# Patient Record
Sex: Male | Born: 1960 | Race: Black or African American | Hispanic: No | Marital: Married | Smoking: Former smoker
Health system: Southern US, Community
[De-identification: ages and names within clinical notes are randomized; demographics above are authoritative.]

## PROBLEM LIST (undated history)

## (undated) DIAGNOSIS — E119 Type 2 diabetes mellitus without complications: Secondary | ICD-10-CM

## (undated) DIAGNOSIS — M48062 Spinal stenosis, lumbar region with neurogenic claudication: Secondary | ICD-10-CM

## (undated) DIAGNOSIS — I10 Essential (primary) hypertension: Secondary | ICD-10-CM

## (undated) DIAGNOSIS — M5416 Radiculopathy, lumbar region: Secondary | ICD-10-CM

## (undated) DIAGNOSIS — M1990 Unspecified osteoarthritis, unspecified site: Secondary | ICD-10-CM

## (undated) DIAGNOSIS — I2510 Atherosclerotic heart disease of native coronary artery without angina pectoris: Secondary | ICD-10-CM

## (undated) DIAGNOSIS — I517 Cardiomegaly: Secondary | ICD-10-CM

## (undated) DIAGNOSIS — Z955 Presence of coronary angioplasty implant and graft: Secondary | ICD-10-CM

## (undated) DIAGNOSIS — K219 Gastro-esophageal reflux disease without esophagitis: Secondary | ICD-10-CM

## (undated) DIAGNOSIS — E7800 Pure hypercholesterolemia, unspecified: Secondary | ICD-10-CM

## (undated) DIAGNOSIS — D649 Anemia, unspecified: Secondary | ICD-10-CM

## (undated) HISTORY — PX: NO PAST SURGERIES: SHX2092

---

## 2014-12-28 ENCOUNTER — Other Ambulatory Visit: Payer: Self-pay | Admitting: Internal Medicine

## 2014-12-28 ENCOUNTER — Ambulatory Visit
Admission: RE | Admit: 2014-12-28 | Discharge: 2014-12-28 | Disposition: A | Discharge status: Home / Self Care | Payer: Managed Care, Other (non HMO) | Source: Ambulatory Visit | Attending: Internal Medicine | Admitting: Internal Medicine

## 2014-12-28 DIAGNOSIS — M25551 Pain in right hip: Secondary | ICD-10-CM

## 2016-04-21 DIAGNOSIS — Z862 Personal history of diseases of the blood and blood-forming organs and certain disorders involving the immune mechanism: Secondary | ICD-10-CM | POA: Insufficient documentation

## 2016-04-21 DIAGNOSIS — E119 Type 2 diabetes mellitus without complications: Secondary | ICD-10-CM | POA: Insufficient documentation

## 2016-07-16 DIAGNOSIS — M1611 Unilateral primary osteoarthritis, right hip: Secondary | ICD-10-CM | POA: Diagnosis present

## 2016-07-26 ENCOUNTER — Encounter
Admission: RE | Admit: 2016-07-26 | Discharge: 2016-07-26 | Disposition: A | Discharge status: Home / Self Care | Payer: Managed Care, Other (non HMO) | Source: Ambulatory Visit | Attending: Orthopedic Surgery | Admitting: Orthopedic Surgery

## 2016-07-26 DIAGNOSIS — E119 Type 2 diabetes mellitus without complications: Secondary | ICD-10-CM | POA: Insufficient documentation

## 2016-07-26 DIAGNOSIS — I10 Essential (primary) hypertension: Secondary | ICD-10-CM | POA: Diagnosis not present

## 2016-07-26 HISTORY — DX: Essential (primary) hypertension: I10

## 2016-07-26 HISTORY — DX: Type 2 diabetes mellitus without complications: E11.9

## 2016-07-26 HISTORY — DX: Unspecified osteoarthritis, unspecified site: M19.90

## 2016-07-26 LAB — BASIC METABOLIC PANEL
ANION GAP: 7 (ref 5–15)
BUN: 9 mg/dL (ref 6–20)
CHLORIDE: 106 mmol/L (ref 101–111)
CO2: 27 mmol/L (ref 22–32)
Calcium: 9.7 mg/dL (ref 8.9–10.3)
Creatinine, Ser: 1.2 mg/dL (ref 0.61–1.24)
GFR calc non Af Amer: >60 mL/min (ref >60)
GLUCOSE: 106 mg/dL — AB (ref 65–99)
Potassium: 3.8 mmol/L (ref 3.5–5.1)
Sodium: 140 mmol/L (ref 135–145)

## 2016-07-26 LAB — URINALYSIS COMPLETE WITH MICROSCOPIC (ARMC ONLY)
BACTERIA UA: NONE SEEN
Bilirubin Urine: NEGATIVE
Glucose, UA: NEGATIVE mg/dL
HGB URINE DIPSTICK: NEGATIVE
Ketones, ur: NEGATIVE mg/dL
LEUKOCYTES UA: NEGATIVE
Nitrite: NEGATIVE
PH: 5 (ref 5.0–8.0)
Protein, ur: NEGATIVE mg/dL
Specific Gravity, Urine: 1.016 (ref 1.005–1.030)

## 2016-07-26 LAB — CBC
HEMATOCRIT: 41.4 % (ref 40.0–52.0)
HEMOGLOBIN: 13.5 g/dL (ref 13.0–18.0)
MCH: 27.5 pg (ref 26.0–34.0)
MCHC: 32.5 g/dL (ref 32.0–36.0)
MCV: 84.5 fL (ref 80.0–100.0)
Platelets: 362 10*3/uL (ref 150–440)
RBC: 4.9 MIL/uL (ref 4.40–5.90)
RDW: 14.7 % — ABNORMAL HIGH (ref 11.5–14.5)
WBC: 8.2 10*3/uL (ref 3.8–10.6)

## 2016-07-26 LAB — APTT: APTT: 33 s (ref 24–36)

## 2016-07-26 LAB — TYPE AND SCREEN
ABO/RH(D): B POS
Antibody Screen: NEGATIVE

## 2016-07-26 LAB — SEDIMENTATION RATE: SED RATE: 11 mm/h (ref 0–20)

## 2016-07-26 LAB — SURGICAL PCR SCREEN
MRSA, PCR: NEGATIVE
STAPHYLOCOCCUS AUREUS: POSITIVE — AB

## 2016-07-26 LAB — PROTIME-INR
INR: 1.02
Prothrombin Time: 13.4 seconds (ref 11.4–15.2)

## 2016-07-26 NOTE — Patient Instructions (Signed)
  Your procedure is scheduled ZO:XWRUEAon:Monday Oct. 23 , 2017. Report to Same Day Surgery. To find out your arrival time please call [phone] between 1PM - 3PM on Friday Oct. 20, 2017.  Remember: Instructions that are not followed completely may result in serious medical risk, up to and including death, or upon the discretion of your surgeon and anesthesiologist your surgery may need to be rescheduled.    _x___ 1. Do not eat food or drink liquids after midnight. No gum chewing or hard candies.     _x___ 2. No Alcohol for 24 hours before or after surgery.   ____ 3. Bring all medications with you on the day of surgery if instructed.    __x__ 4. Notify your doctor if there is any change in your medical condition     (cold, fever, infections).    _____ 5. No smoking 24 hours prior to surgery.     Do not wear jewelry, make-up, hairpins, clips or nail polish.  Do not wear lotions, powders, or perfumes.   Do not shave 48 hours prior to surgery. Men may shave face and neck.  Do not bring valuables to the hospital.    [facility] is not responsible for any belongings or valuables.               Contacts, dentures or bridgework may not be worn into surgery.  Leave your suitcase in the car. After surgery it may be brought to your room.  For patients admitted to the hospital, discharge time is determined by your treatment team.   Patients discharged the day of surgery will not be allowed to drive home.    Please read over the following fact sheets that you were given:   [facility] Pre[facility]  __x__ Take these medicines the morning of surgery with A SIP OF WATER:    1. losartan (COZAAR)   2. atorvastatin (LIPITOR)    ____ Fleet Enema (as directed)   _x___ Use CHG Soap as directed on instruction sheet  ____ Use inhalers on the day of surgery and bring to hospital day of surgery  __x__ Stop metformin 2 days prior to surgery August 05, 2016.    ____ Take 1/2 of usual insulin  dose the night before surgery and none on the morning of surgery.   __x__ Stop aspirin 7 days prior.  __x__ Stop Anti-inflammatories such as Advil, Aleve, Ibuprofen, Motrin, Naproxen, Naprosyn, Goodies powders or aspirin products. OK to take Tylenol or Tramadol.   ____ Stop supplements until after surgery.    ____ Bring C-Pap to the hospital.

## 2016-07-27 LAB — HEMOGLOBIN A1C
Hgb A1c MFr Bld: 6.1 % — ABNORMAL HIGH (ref 4.8–5.6)
Mean Plasma Glucose: 128 mg/dL

## 2016-07-27 LAB — URINE CULTURE
CULTURE: NO GROWTH
Special Requests: NORMAL

## 2016-07-27 NOTE — Pre-Procedure Instructions (Signed)
Spoke with Consuella LoseElaine at Dr. Elenor LegatoHooten's office to let her know a fax was sent with the nasal swab results:MRSA negative and Staph Aureus positive.

## 2016-08-07 ENCOUNTER — Encounter: Payer: Self-pay | Admitting: Orthopedic Surgery

## 2016-08-07 ENCOUNTER — Inpatient Hospital Stay: Payer: Managed Care, Other (non HMO)

## 2016-08-07 ENCOUNTER — Inpatient Hospital Stay: Payer: Managed Care, Other (non HMO) | Admitting: Anesthesiology

## 2016-08-07 ENCOUNTER — Inpatient Hospital Stay
Admission: RE | Admit: 2016-08-07 | Discharge: 2016-08-09 | DRG: 470 | Disposition: A | Discharge status: Home / Self Care | Payer: Managed Care, Other (non HMO) | Source: Ambulatory Visit | Attending: Orthopedic Surgery | Admitting: Orthopedic Surgery

## 2016-08-07 ENCOUNTER — Encounter
Admission: RE | Disposition: A | Discharge status: Home / Self Care | Payer: Self-pay | Source: Ambulatory Visit | Attending: Orthopedic Surgery

## 2016-08-07 DIAGNOSIS — Z79899 Other long term (current) drug therapy: Secondary | ICD-10-CM

## 2016-08-07 DIAGNOSIS — Z833 Family history of diabetes mellitus: Secondary | ICD-10-CM | POA: Diagnosis not present

## 2016-08-07 DIAGNOSIS — R262 Difficulty in walking, not elsewhere classified: Secondary | ICD-10-CM

## 2016-08-07 DIAGNOSIS — M1611 Unilateral primary osteoarthritis, right hip: Secondary | ICD-10-CM | POA: Diagnosis present

## 2016-08-07 DIAGNOSIS — D62 Acute posthemorrhagic anemia: Secondary | ICD-10-CM | POA: Diagnosis not present

## 2016-08-07 DIAGNOSIS — E119 Type 2 diabetes mellitus without complications: Secondary | ICD-10-CM | POA: Diagnosis present

## 2016-08-07 DIAGNOSIS — Z8249 Family history of ischemic heart disease and other diseases of the circulatory system: Secondary | ICD-10-CM | POA: Diagnosis not present

## 2016-08-07 DIAGNOSIS — Z7984 Long term (current) use of oral hypoglycemic drugs: Secondary | ICD-10-CM

## 2016-08-07 DIAGNOSIS — Z96649 Presence of unspecified artificial hip joint: Secondary | ICD-10-CM

## 2016-08-07 DIAGNOSIS — Z809 Family history of malignant neoplasm, unspecified: Secondary | ICD-10-CM | POA: Diagnosis not present

## 2016-08-07 DIAGNOSIS — Z888 Allergy status to other drugs, medicaments and biological substances status: Secondary | ICD-10-CM | POA: Diagnosis not present

## 2016-08-07 DIAGNOSIS — E7800 Pure hypercholesterolemia, unspecified: Secondary | ICD-10-CM | POA: Insufficient documentation

## 2016-08-07 DIAGNOSIS — Z87891 Personal history of nicotine dependence: Secondary | ICD-10-CM | POA: Diagnosis not present

## 2016-08-07 DIAGNOSIS — I10 Essential (primary) hypertension: Secondary | ICD-10-CM | POA: Diagnosis present

## 2016-08-07 HISTORY — PX: TOTAL HIP ARTHROPLASTY: SHX124

## 2016-08-07 LAB — GLUCOSE, CAPILLARY
GLUCOSE-CAPILLARY: 126 mg/dL — AB (ref 65–99)
GLUCOSE-CAPILLARY: 98 mg/dL (ref 65–99)
GLUCOSE-CAPILLARY: 119 mg/dL — AB (ref 65–99)
Glucose-Capillary: 131 mg/dL — ABNORMAL HIGH (ref 65–99)
Glucose-Capillary: 108 mg/dL — ABNORMAL HIGH (ref 65–99)

## 2016-08-07 LAB — ABO/RH: ABO/RH(D): B POS

## 2016-08-07 SURGERY — ARTHROPLASTY, HIP, TOTAL,POSTERIOR APPROACH
Anesthesia: Spinal | Laterality: Right | Wound class: Clean

## 2016-08-07 MED ORDER — ACETAMINOPHEN 10 MG/ML IV SOLN
INTRAVENOUS | Status: AC
  Filled 2016-08-07: qty 100

## 2016-08-07 MED ORDER — CHLORHEXIDINE GLUCONATE 4 % EX LIQD: 60.0000 mL | Freq: Once | CUTANEOUS | Status: DC

## 2016-08-07 MED ORDER — TRAMADOL HCL 50 MG PO TABS
50.0000 mg | ORAL_TABLET | ORAL | Status: DC | PRN
  Administered 2016-08-07: 50 mg via ORAL
  Administered 2016-08-08 (×3): 100 mg via ORAL
  Filled 2016-08-07 (×2): qty 2
  Filled 2016-08-07: qty 1
  Filled 2016-08-07: qty 2

## 2016-08-07 MED ORDER — TETRACAINE HCL 1 % IJ SOLN
INTRAMUSCULAR | Status: AC
  Filled 2016-08-07: qty 2

## 2016-08-07 MED ORDER — ACETAMINOPHEN 10 MG/ML IV SOLN
1000.0000 mg | Freq: Four times a day (QID) | INTRAVENOUS | Status: AC
  Administered 2016-08-07 – 2016-08-08 (×4): 1000 mg via INTRAVENOUS
  Filled 2016-08-07 (×4): qty 100

## 2016-08-07 MED ORDER — SODIUM CHLORIDE 0.9 % IV SOLN
INTRAVENOUS | Status: DC
  Administered 2016-08-07 – 2016-08-08 (×3): via INTRAVENOUS

## 2016-08-07 MED ORDER — BISACODYL 10 MG RE SUPP: 10.0000 mg | Freq: Every day | RECTAL | Status: DC | PRN

## 2016-08-07 MED ORDER — FAMOTIDINE 20 MG PO TABS
ORAL_TABLET | ORAL | Status: AC
  Administered 2016-08-07: 20 mg via ORAL
  Filled 2016-08-07: qty 1

## 2016-08-07 MED ORDER — PANTOPRAZOLE SODIUM 40 MG PO TBEC
40.0000 mg | DELAYED_RELEASE_TABLET | Freq: Two times a day (BID) | ORAL | Status: DC
  Administered 2016-08-07 – 2016-08-09 (×5): 40 mg via ORAL
  Filled 2016-08-07 (×5): qty 1

## 2016-08-07 MED ORDER — FLEET ENEMA 7-19 GM/118ML RE ENEM: 1.0000 | ENEMA | Freq: Once | RECTAL | Status: DC | PRN

## 2016-08-07 MED ORDER — ENOXAPARIN SODIUM 30 MG/0.3ML ~~LOC~~ SOLN
30.0000 mg | Freq: Two times a day (BID) | SUBCUTANEOUS | Status: DC
  Administered 2016-08-08 – 2016-08-09 (×3): 30 mg via SUBCUTANEOUS
  Filled 2016-08-07 (×3): qty 0.3

## 2016-08-07 MED ORDER — FENTANYL CITRATE (PF) 100 MCG/2ML IJ SOLN: 25.0000 ug | INTRAMUSCULAR | Status: DC | PRN

## 2016-08-07 MED ORDER — EPHEDRINE SULFATE 50 MG/ML IJ SOLN
INTRAMUSCULAR | Status: DC | PRN
  Administered 2016-08-07: 10 mg via INTRAVENOUS
  Administered 2016-08-07: 5 mg via INTRAVENOUS
  Administered 2016-08-07: 10 mg via INTRAVENOUS
  Administered 2016-08-07: 5 mg via INTRAVENOUS

## 2016-08-07 MED ORDER — MIDAZOLAM HCL 5 MG/5ML IJ SOLN
INTRAMUSCULAR | Status: DC | PRN
  Administered 2016-08-07: 1 mg via INTRAVENOUS

## 2016-08-07 MED ORDER — ACETAMINOPHEN 10 MG/ML IV SOLN
INTRAVENOUS | Status: DC | PRN
  Administered 2016-08-07: 1000 mg via INTRAVENOUS

## 2016-08-07 MED ORDER — SODIUM CHLORIDE 0.9 % IV SOLN
INTRAVENOUS | Status: DC | PRN
  Administered 2016-08-07: 30 ug/min via INTRAVENOUS

## 2016-08-07 MED ORDER — ONDANSETRON HCL 4 MG/2ML IJ SOLN
INTRAMUSCULAR | Status: DC | PRN
  Administered 2016-08-07: 4 mg via INTRAVENOUS

## 2016-08-07 MED ORDER — BUPIVACAINE HCL (PF) 0.5 % IJ SOLN
INTRAMUSCULAR | Status: DC | PRN
  Administered 2016-08-07: 2 mL

## 2016-08-07 MED ORDER — OXYCODONE HCL 5 MG/5ML PO SOLN: 5.0000 mg | Freq: Once | ORAL | Status: DC | PRN

## 2016-08-07 MED ORDER — CEFAZOLIN SODIUM-DEXTROSE 2-4 GM/100ML-% IV SOLN
2.0000 g | INTRAVENOUS | Status: AC
  Administered 2016-08-07: 2 g via INTRAVENOUS

## 2016-08-07 MED ORDER — DIPHENHYDRAMINE HCL 12.5 MG/5ML PO ELIX: 12.5000 mg | ORAL_SOLUTION | ORAL | Status: DC | PRN

## 2016-08-07 MED ORDER — PROPOFOL 10 MG/ML IV BOLUS
INTRAVENOUS | Status: DC | PRN
  Administered 2016-08-07: 10 mg via INTRAVENOUS
  Administered 2016-08-07: 30 mg via INTRAVENOUS

## 2016-08-07 MED ORDER — TRANEXAMIC ACID 1000 MG/10ML IV SOLN
1000.0000 mg | Freq: Once | INTRAVENOUS | Status: AC
  Administered 2016-08-07: 1000 mg via INTRAVENOUS
  Filled 2016-08-07: qty 10

## 2016-08-07 MED ORDER — NEOMYCIN-POLYMYXIN B GU 40-200000 IR SOLN
Status: AC
  Filled 2016-08-07: qty 20

## 2016-08-07 MED ORDER — SODIUM CHLORIDE 0.9 % IV SOLN: INTRAVENOUS | Status: DC

## 2016-08-07 MED ORDER — PROPOFOL 500 MG/50ML IV EMUL
INTRAVENOUS | Status: DC | PRN
Start: 2016-08-07 — End: 2016-08-07
  Administered 2016-08-07: 75 ug/kg/min via INTRAVENOUS

## 2016-08-07 MED ORDER — FENTANYL CITRATE (PF) 100 MCG/2ML IJ SOLN
INTRAMUSCULAR | Status: DC | PRN
Start: 2016-08-07 — End: 2016-08-07
  Administered 2016-08-07 (×4): 25 ug via INTRAVENOUS

## 2016-08-07 MED ORDER — TRANEXAMIC ACID 1000 MG/10ML IV SOLN
1000.0000 mg | INTRAVENOUS | Status: AC
  Administered 2016-08-07: 1000 mg via INTRAVENOUS
  Filled 2016-08-07: qty 10

## 2016-08-07 MED ORDER — OXYCODONE HCL 5 MG PO TABS: 5.0000 mg | ORAL_TABLET | Freq: Once | ORAL | Status: DC | PRN

## 2016-08-07 MED ORDER — SODIUM CHLORIDE 0.9 % IV SOLN
INTRAVENOUS | Status: DC
  Administered 2016-08-07: 07:00:00 via INTRAVENOUS

## 2016-08-07 MED ORDER — METOCLOPRAMIDE HCL 10 MG PO TABS
10.0000 mg | ORAL_TABLET | Freq: Three times a day (TID) | ORAL | Status: AC
Start: 2016-08-07 — End: 2016-08-09
  Administered 2016-08-07 – 2016-08-09 (×7): 10 mg via ORAL
  Filled 2016-08-07 (×8): qty 1

## 2016-08-07 MED ORDER — OXYCODONE HCL 5 MG PO TABS
5.0000 mg | ORAL_TABLET | ORAL | Status: DC | PRN
  Administered 2016-08-07 – 2016-08-08 (×4): 10 mg via ORAL
  Administered 2016-08-08: 5 mg via ORAL
  Administered 2016-08-08 – 2016-08-09 (×3): 10 mg via ORAL
  Filled 2016-08-07 (×8): qty 2

## 2016-08-07 MED ORDER — NEOMYCIN-POLYMYXIN B GU 40-200000 IR SOLN
Status: DC | PRN
  Administered 2016-08-07: 16 mL

## 2016-08-07 MED ORDER — HYDRALAZINE HCL 20 MG/ML IJ SOLN
10.0000 mg | Freq: Once | INTRAMUSCULAR | Status: AC
  Administered 2016-08-07: 10 mg via INTRAVENOUS
  Filled 2016-08-07: qty 1

## 2016-08-07 MED ORDER — FAMOTIDINE 20 MG PO TABS: 20.0000 mg | ORAL_TABLET | Freq: Once | ORAL | Status: DC

## 2016-08-07 MED ORDER — MORPHINE SULFATE (PF) 2 MG/ML IV SOLN
2.0000 mg | INTRAVENOUS | Status: DC | PRN
  Administered 2016-08-07: 2 mg via INTRAVENOUS
  Filled 2016-08-07: qty 1

## 2016-08-07 MED ORDER — ALUM & MAG HYDROXIDE-SIMETH 200-200-20 MG/5ML PO SUSP: 30.0000 mL | ORAL | Status: DC | PRN

## 2016-08-07 MED ORDER — PHENOL 1.4 % MT LIQD
1.0000 | OROMUCOSAL | Status: DC | PRN
  Filled 2016-08-07: qty 177

## 2016-08-07 MED ORDER — FERROUS SULFATE 325 (65 FE) MG PO TABS
325.0000 mg | ORAL_TABLET | Freq: Two times a day (BID) | ORAL | Status: DC
  Administered 2016-08-07 – 2016-08-09 (×4): 325 mg via ORAL
  Filled 2016-08-07 (×4): qty 1

## 2016-08-07 MED ORDER — ACETAMINOPHEN 325 MG PO TABS
650.0000 mg | ORAL_TABLET | Freq: Four times a day (QID) | ORAL | Status: DC | PRN
  Administered 2016-08-08: 650 mg via ORAL
  Filled 2016-08-07: qty 2

## 2016-08-07 MED ORDER — SENNOSIDES-DOCUSATE SODIUM 8.6-50 MG PO TABS
1.0000 | ORAL_TABLET | Freq: Two times a day (BID) | ORAL | Status: DC
  Administered 2016-08-07 – 2016-08-09 (×5): 1 via ORAL
  Filled 2016-08-07 (×5): qty 1

## 2016-08-07 MED ORDER — ONDANSETRON HCL 4 MG PO TABS: 4.0000 mg | ORAL_TABLET | Freq: Four times a day (QID) | ORAL | Status: DC | PRN

## 2016-08-07 MED ORDER — ACETAMINOPHEN 650 MG RE SUPP: 650.0000 mg | Freq: Four times a day (QID) | RECTAL | Status: DC | PRN

## 2016-08-07 MED ORDER — MENTHOL 3 MG MT LOZG
1.0000 | LOZENGE | OROMUCOSAL | Status: DC | PRN
  Filled 2016-08-07: qty 9

## 2016-08-07 MED ORDER — METFORMIN HCL 500 MG PO TABS
500.0000 mg | ORAL_TABLET | Freq: Every day | ORAL | Status: DC
  Administered 2016-08-07 – 2016-08-08 (×2): 500 mg via ORAL
  Filled 2016-08-07 (×2): qty 1

## 2016-08-07 MED ORDER — CEFAZOLIN SODIUM-DEXTROSE 2-4 GM/100ML-% IV SOLN
INTRAVENOUS | Status: AC
  Filled 2016-08-07: qty 100

## 2016-08-07 MED ORDER — TETRACAINE HCL 1 % IJ SOLN
INTRAMUSCULAR | Status: DC | PRN
  Administered 2016-08-07: 10 mg via INTRASPINAL

## 2016-08-07 MED ORDER — CEFAZOLIN SODIUM-DEXTROSE 2-4 GM/100ML-% IV SOLN
2.0000 g | Freq: Four times a day (QID) | INTRAVENOUS | Status: AC
  Administered 2016-08-07 – 2016-08-08 (×4): 2 g via INTRAVENOUS
  Filled 2016-08-07 (×4): qty 100

## 2016-08-07 MED ORDER — ONDANSETRON HCL 4 MG/2ML IJ SOLN: 4.0000 mg | Freq: Four times a day (QID) | INTRAMUSCULAR | Status: DC | PRN

## 2016-08-07 MED ORDER — ATORVASTATIN CALCIUM 10 MG PO TABS
10.0000 mg | ORAL_TABLET | Freq: Every day | ORAL | Status: DC
  Administered 2016-08-08: 10 mg via ORAL
  Filled 2016-08-07: qty 1

## 2016-08-07 MED ORDER — MAGNESIUM HYDROXIDE 400 MG/5ML PO SUSP: 30.0000 mL | Freq: Every day | ORAL | Status: DC | PRN

## 2016-08-07 MED ORDER — FAMOTIDINE 20 MG PO TABS
20.0000 mg | ORAL_TABLET | Freq: Once | ORAL | Status: AC
  Administered 2016-08-07: 20 mg via ORAL

## 2016-08-07 MED ORDER — INSULIN ASPART 100 UNIT/ML ~~LOC~~ SOLN
0.0000 [IU] | Freq: Three times a day (TID) | SUBCUTANEOUS | Status: DC
  Administered 2016-08-08: 3 [IU] via SUBCUTANEOUS
  Administered 2016-08-08 – 2016-08-09 (×2): 2 [IU] via SUBCUTANEOUS
  Filled 2016-08-07 (×2): qty 2
  Filled 2016-08-07: qty 3

## 2016-08-07 MED ORDER — PHENYLEPHRINE HCL 10 MG/ML IJ SOLN
INTRAMUSCULAR | Status: DC | PRN
  Administered 2016-08-07: 100 ug via INTRAVENOUS
  Administered 2016-08-07: 200 ug via INTRAVENOUS
  Administered 2016-08-07 (×3): 100 ug via INTRAVENOUS
  Administered 2016-08-07 (×2): 200 ug via INTRAVENOUS
  Administered 2016-08-07: 100 ug via INTRAVENOUS

## 2016-08-07 MED ORDER — LOSARTAN POTASSIUM 50 MG PO TABS
100.0000 mg | ORAL_TABLET | Freq: Every day | ORAL | Status: DC
  Administered 2016-08-08 – 2016-08-09 (×2): 100 mg via ORAL
  Filled 2016-08-07 (×2): qty 2

## 2016-08-07 SURGICAL SUPPLY — 51 items
BLADE DRUM FLTD (BLADE) ×3 IMPLANT
BLADE SAW 1 (BLADE) ×3 IMPLANT
CANISTER SUCT 1200ML W/VALVE (MISCELLANEOUS) ×3 IMPLANT
CANISTER SUCT 3000ML (MISCELLANEOUS) ×6 IMPLANT
CAPT HIP TOTAL 2 ×3 IMPLANT
CARTRIDGE OIL MAESTRO DRILL (MISCELLANEOUS) ×1 IMPLANT
CATH FOL LEG HOLDER (MISCELLANEOUS) ×3 IMPLANT
CATH TRAY METER 16FR LF (MISCELLANEOUS) ×3 IMPLANT
DIFFUSER MAESTRO (MISCELLANEOUS) ×3 IMPLANT
DRAPE INCISE IOBAN 66X60 STRL (DRAPES) ×3 IMPLANT
DRAPE SHEET LG 3/4 BI-LAMINATE (DRAPES) ×3 IMPLANT
DRSG DERMACEA 8X12 NADH (GAUZE/BANDAGES/DRESSINGS) ×3 IMPLANT
DRSG OPSITE POSTOP 3X4 (GAUZE/BANDAGES/DRESSINGS) ×3 IMPLANT
DRSG OPSITE POSTOP 4X12 (GAUZE/BANDAGES/DRESSINGS) IMPLANT
DRSG OPSITE POSTOP 4X14 (GAUZE/BANDAGES/DRESSINGS) ×3 IMPLANT
DRSG TEGADERM 4X4.75 (GAUZE/BANDAGES/DRESSINGS) ×3 IMPLANT
DURAPREP 26ML APPLICATOR (WOUND CARE) ×3 IMPLANT
ELECT BLADE 6.5 EXT (BLADE) ×3 IMPLANT
ELECT CAUTERY BLADE 6.4 (BLADE) ×3 IMPLANT
GLOVE BIOGEL M STRL SZ7.5 (GLOVE) ×6 IMPLANT
GLOVE INDICATOR 8.0 STRL GRN (GLOVE) ×3 IMPLANT
GLOVE SURG 9.0 ORTHO LTXF (GLOVE) ×3 IMPLANT
GLOVE SURG ORTHO 9.0 STRL STRW (GLOVE) ×3 IMPLANT
GOWN STRL REUS W/ TWL LRG LVL3 (GOWN DISPOSABLE) ×2 IMPLANT
GOWN STRL REUS W/TWL 2XL LVL3 (GOWN DISPOSABLE) ×3 IMPLANT
GOWN STRL REUS W/TWL LRG LVL3 (GOWN DISPOSABLE) ×4
HANDPIECE INTERPULSE COAX TIP (DISPOSABLE) ×2
HEMOVAC 400CC 10FR (MISCELLANEOUS) ×3 IMPLANT
HOOD PEEL AWAY FLYTE STAYCOOL (MISCELLANEOUS) ×6 IMPLANT
KIT RM TURNOVER STRD PROC AR (KITS) ×3 IMPLANT
NDL SAFETY 18GX1.5 (NEEDLE) ×3 IMPLANT
NS IRRIG 500ML POUR BTL (IV SOLUTION) ×3 IMPLANT
OIL CARTRIDGE MAESTRO DRILL (MISCELLANEOUS) ×3
PACK HIP PROSTHESIS (MISCELLANEOUS) ×3 IMPLANT
PIN STEIN THRED 5/32 (Pin) ×3 IMPLANT
SET HNDPC FAN SPRY TIP SCT (DISPOSABLE) ×1 IMPLANT
SOL .9 NS 3000ML IRR  AL (IV SOLUTION) ×2
SOL .9 NS 3000ML IRR UROMATIC (IV SOLUTION) ×1 IMPLANT
SOL PREP PVP 2OZ (MISCELLANEOUS) ×3
SOLUTION PREP PVP 2OZ (MISCELLANEOUS) ×1 IMPLANT
SPONGE DRAIN TRACH 4X4 STRL 2S (GAUZE/BANDAGES/DRESSINGS) ×3 IMPLANT
STAPLER SKIN PROX 35W (STAPLE) ×3 IMPLANT
SUT ETHIBOND #5 BRAIDED 30INL (SUTURE) ×3 IMPLANT
SUT VIC AB 0 CT1 36 (SUTURE) ×3 IMPLANT
SUT VIC AB 1 CT1 36 (SUTURE) ×6 IMPLANT
SUT VIC AB 2-0 CT1 27 (SUTURE) ×2
SUT VIC AB 2-0 CT1 TAPERPNT 27 (SUTURE) ×1 IMPLANT
SYR 20CC LL (SYRINGE) ×3 IMPLANT
TAPE ADH 3 LX (MISCELLANEOUS) ×3 IMPLANT
TAPE TRANSPORE STRL 2 31045 (GAUZE/BANDAGES/DRESSINGS) ×3 IMPLANT
TOWEL OR 17X26 4PK STRL BLUE (TOWEL DISPOSABLE) ×3 IMPLANT

## 2016-08-07 NOTE — Op Note (Signed)
OPERATIVE NOTE  DATE OF SURGERY:  08/07/2016  PATIENT NAME:  Samuel Hansen   DOB: October 17, 1960  MRN: [id number]  PRE-OPERATIVE DIAGNOSIS: Degenerative arthrosis of the right hip, primary  POST-OPERATIVE DIAGNOSIS:  Same  PROCEDURE:  Right total hip arthroplasty  SURGEON:  Jena GaussJames P Diogo Anne, Jr. M.D.  ASSISTANT:  Van ClinesJon Wolfe, PA (present and scrubbed throughout the case, critical for assistance with exposure, retraction, instrumentation, and closure)  ANESTHESIA: spinal  ESTIMATED BLOOD LOSS: 400 mL  FLUIDS REPLACED: 1600 mL of crystalloid  DRAINS: 2 medium drains to a Hemovac reservoir  IMPLANTS UTILIZED: DePuy 12 mm small stature AML femoral stem, 52 mm OD Pinnacle 100 acetabular component, neutral Pinnacle Altrx polyethylene insert, and a 36 mm M-SPEC +1.5 mm hip ball  INDICATIONS FOR SURGERY: Samuel HaddockWillie Hansen is a 55 y.o. year old male with a long history of progressive hip and groin  pain. X-rays demonstrated severe degenerative changes. The patient had not seen any significant improvement despite conservative nonsurgical intervention. After discussion of the risks and benefits of surgical intervention, the patient expressed understanding of the risks benefits and agree with plans for total hip arthroplasty.   The risks, benefits, and alternatives were discussed at length including but not limited to the risks of infection, bleeding, nerve injury, stiffness, blood clots, the need for revision surgery, limb length inequality, dislocation, cardiopulmonary complications, among others, and they were willing to proceed.  PROCEDURE IN DETAIL: The patient was brought into the operating room and, after adequate spinal anesthesia was achieved, the patient was placed in a left lateral decubitus position. Axillary roll was placed and all bony prominences were well-padded. The patient's right hip was cleaned and prepped with alcohol and DuraPrep and draped in the usual sterile fashion. A "timeout" was  performed as per usual protocol. A lateral curvilinear incision was made gently curving towards the posterior superior iliac spine. The IT band was incised in line with the skin incision and the fibers of the gluteus maximus were split in line. The piriformis tendon was identified, skeletonized, and incised at its insertion to the proximal femur and reflected posteriorly. A T type posterior capsulotomy was performed. Prior to dislocation of the femoral head, a threaded Steinmann pin was inserted through a separate stab incision into the pelvis superior to the acetabulum and bent in the form of a stylus so as to assess limb length and hip offset throughout the procedure. The femoral head was then dislocated posteriorly. Inspection of the femoral head demonstrated severe degenerative changes with full-thickness loss of articular cartilage. The femoral neck cut was performed using an oscillating saw. The anterior capsule was elevated off of the femoral neck using a periosteal elevator. Attention was then directed to the acetabulum. The remnant of the labrum was excised using electrocautery. Inspection of the acetabulum also demonstrated significant degenerative changes. The acetabulum was reamed in sequential fashion up to a 51 mm diameter. Good punctate bleeding bone was encountered. A 52 mm Pinnacle 100 acetabular component was positioned and impacted into place. Good scratch fit was appreciated. A neutral polyethylene trial was inserted.  Attention was then directed to the proximal femur. A hole for reaming of the proximal femoral canal was created using a high-speed burr. The femoral canal was reamed in sequential fashion up to a 12 mm diameter. This allowed for approximately 7 cm of scratch fit. Serial broaches were inserted up to a 12 mm small stature femoral broach. Calcar region was planed and a trial reduction was performed using a  36 mm hip ball with a +1.5 mm neck length. Good equalization of limb lengths  and hip offset was appreciated and excellent stability was noted both anteriorly and posteriorly. Trial components were removed. The acetabular shell was irrigated with copious amounts of normal saline with antibiotic solution and suctioned dry. A neutral Pinnacle Altrx polyethylene insert was positioned and impacted into place. Next, a 12 mm small stature AML femoral stem was positioned and impacted into place. Excellent scratch fit was appreciated. A trial reduction was again performed with a 36 mm hip ball with a +1.5 mm neck length. Again, good equalization of limb lengths was appreciated and excellent stability appreciated both anteriorly and posteriorly. The hip was then dislocated and the trial hip ball was removed. The Morse taper was cleaned and dried. A 36 mm M-SPEC hip ball with a +1.5 mm neck length was placed on the trunnion and impacted into place. The hip was then reduced and placed through range of motion. Excellent stability was appreciated both anteriorly and posteriorly.  The wound was irrigated with copious amounts of normal saline with antibiotic solution and suctioned dry. Good hemostasis was appreciated. The posterior capsulotomy was repaired using #5 Ethibond. Piriformis tendon was reapproximated to the undersurface of the gluteus medius tendon using #5 Ethibond. Two medium drains were placed in the wound bed and brought out through separate stab incisions to be attached to a Hemovac reservoir. The IT band was reapproximated using interrupted sutures of #1 Vicryl. Subcutaneous tissue was approximated using first #0 Vicryl followed by #2-0 Vicryl. The skin was closed with skin staples.  The patient tolerated the procedure well and was transported to the recovery room in stable condition.   Jena Gauss., M.D.

## 2016-08-07 NOTE — Progress Notes (Signed)
Pt tolerated getting up out of bed this evening.

## 2016-08-07 NOTE — NC FL2 (Signed)
[place] MEDICAID FL2 LEVEL OF CARE SCREENING TOOL     IDENTIFICATION  Patient Name: Samuel HaddockWillie Rispoli Birthdate: 08/30/1961 Sex: male Admission Date (Current Location): 08/07/2016  [place]   Facility and Address:  [facility], [address], [place], [place] [address]      Provider Number: [id number]  Attending Physician Name and Address:  Donato HeinzJames P Hooten, MD  Relative Name and Phone Number:       Current Level of Care: Hospital Recommended Level of Care: Skilled Nursing Facility Prior Approval Number:    Date Approved/Denied:   PASRR Number:  [id number])  Discharge Plan: SNF    Current Diagnoses: Patient Active Problem List   Diagnosis Date Noted  . Pure hypercholesterolemia 08/07/2016  . S/P total hip arthroplasty 08/07/2016  . Primary osteoarthritis of right hip 07/16/2016  . History of normocytic normochromic anemia 04/21/2016  . Type 2 diabetes mellitus without complication, without long-term current use of insulin (HCC) 04/21/2016    Orientation RESPIRATION BLADDER Height & Weight     Self, Time, Situation, Place  Normal Continent Weight: 205 lb (93 kg) Height:  5\' 7"  (170.2 cm)  BEHAVIORAL SYMPTOMS/MOOD NEUROLOGICAL BOWEL NUTRITION STATUS   (none)  (none) Continent Diet (Diet: Clear Liquid )  AMBULATORY STATUS COMMUNICATION OF NEEDS Skin   Extensive Assist Verbally Surgical wounds (Incision: Right Hip. )                       Personal Care Assistance Level of Assistance  Bathing, Feeding, Dressing Bathing Assistance: Limited assistance Feeding assistance: Independent Dressing Assistance: Limited assistance     Functional Limitations Info  Sight, Hearing, Speech Sight Info: Adequate Hearing Info: Adequate Speech Info: Adequate    SPECIAL CARE FACTORS FREQUENCY  PT (By licensed PT), OT (By licensed OT)     PT Frequency:  (5) OT Frequency:  (5)            Contractures       Additional Factors Info  Code Status, Allergies, Insulin Sliding Scale Code Status Info:  (Full Code. ) Allergies Info:  (Lisinopril)   Insulin Sliding Scale Info:  (NovoLog Insulin Injections 3 times daily )       Current Medications (08/07/2016):  This is the current hospital active medication list Current Facility-Administered Medications  Medication Dose Route Frequency Provider Last Rate Last Dose  . 0.9 %  sodium chloride infusion   Intravenous Continuous Donato HeinzJames P Hooten, MD 100 mL/hr at 08/07/16 1236    . acetaminophen (OFIRMEV) IV 1,000 mg  1,000 mg Intravenous Q6H Donato HeinzJames P Hooten, MD   1,000 mg at 08/07/16 1534  . acetaminophen (TYLENOL) tablet 650 mg  650 mg Oral Q6H PRN Donato HeinzJames P Hooten, MD       Or  . acetaminophen (TYLENOL) suppository 650 mg  650 mg Rectal Q6H PRN Donato HeinzJames P Hooten, MD      . alum & mag hydroxide-simeth (MAALOX/MYLANTA) 200-200-20 MG/5ML suspension 30 mL  30 mL Oral Q4H PRN Donato HeinzJames P Hooten, MD      . Melene Muller[START ON 08/08/2016] atorvastatin (LIPITOR) tablet 10 mg  10 mg Oral Q1200 Donato HeinzJames P Hooten, MD      . bisacodyl (DULCOLAX) suppository 10 mg  10 mg Rectal Daily PRN Donato HeinzJames P Hooten, MD      . ceFAZolin (ANCEF) IVPB 2g/100 mL premix  2 g Intravenous Q6H Donato HeinzJames P Hooten, MD   2 g at 08/07/16 1336  .  diphenhydrAMINE (BENADRYL) 12.5 MG/5ML elixir 12.5-25 mg  12.5-25 mg Oral Q4H PRN Donato Heinz, MD      . Melene Muller ON 08/08/2016] enoxaparin (LOVENOX) injection 30 mg  30 mg Subcutaneous Q12H Donato Heinz, MD      . ferrous sulfate tablet 325 mg  325 mg Oral BID WC Donato Heinz, MD      . insulin aspart (novoLOG) injection 0-15 Units  0-15 Units Subcutaneous TID WC Donato Heinz, MD      . Melene Muller ON 08/08/2016] losartan (COZAAR) tablet 100 mg  100 mg Oral Daily Donato Heinz, MD      . magnesium hydroxide (MILK OF MAGNESIA) suspension 30 mL  30 mL Oral Daily PRN Donato Heinz, MD      . menthol-cetylpyridinium (CEPACOL) lozenge 3 mg  1 lozenge Oral PRN Donato Heinz,  MD       Or  . phenol (CHLORASEPTIC) mouth spray 1 spray  1 spray Mouth/Throat PRN Donato Heinz, MD      . metFORMIN (GLUCOPHAGE) tablet 500 mg  500 mg Oral Q supper Donato Heinz, MD      . metoCLOPramide (REGLAN) tablet 10 mg  10 mg Oral TID AC & HS Donato Heinz, MD   10 mg at 08/07/16 1235  . morphine 2 MG/ML injection 2 mg  2 mg Intravenous Q2H PRN Donato Heinz, MD   2 mg at 08/07/16 1425  . ondansetron (ZOFRAN) tablet 4 mg  4 mg Oral Q6H PRN Donato Heinz, MD       Or  . ondansetron (ZOFRAN) injection 4 mg  4 mg Intravenous Q6H PRN Donato Heinz, MD      . oxyCODONE (Oxy IR/ROXICODONE) immediate release tablet 5-10 mg  5-10 mg Oral Q4H PRN Donato Heinz, MD   10 mg at 08/07/16 1234  . pantoprazole (PROTONIX) EC tablet 40 mg  40 mg Oral BID Donato Heinz, MD   40 mg at 08/07/16 1235  . senna-docusate (Senokot-S) tablet 1 tablet  1 tablet Oral BID Donato Heinz, MD   1 tablet at 08/07/16 1234  . sodium phosphate (FLEET) 7-19 GM/118ML enema 1 enema  1 enema Rectal Once PRN Donato Heinz, MD      . traMADol Janean Sark) tablet 50-100 mg  50-100 mg Oral Q4H PRN Donato Heinz, MD         Discharge Medications: Please see discharge summary for a list of discharge medications.  Relevant Imaging Results:  Relevant Lab Results:   Additional Information  (SSN: [id number])  Shayaan Parke, Darleen Crocker, LCSW

## 2016-08-07 NOTE — Transfer of Care (Signed)
Immediate Anesthesia Transfer of Care Note  Patient: Samuel HaddockWillie Henningsen  Procedure(s) Performed: Procedure(s): TOTAL HIP ARTHROPLASTY (Right)  Patient Location: PACU  Anesthesia Type:Spinal  Level of Consciousness: awake  Airway & Oxygen Therapy: Patient Spontanous Breathing and Patient connected to face mask oxygen  Post-op Assessment: Report given to RN and Post -op Vital signs reviewed and stable  Post vital signs: Reviewed and stable  Last Vitals:  Vitals:   08/07/16 0625  BP: (!) 164/81  Pulse: 73  Resp: 18  Temp: 36.6 C    Last Pain:  Vitals:   08/07/16 0625  TempSrc: Oral  PainSc: 7          Complications: No apparent anesthesia complications

## 2016-08-07 NOTE — Anesthesia Preprocedure Evaluation (Signed)
Anesthesia Evaluation  Patient identified by MRN, date of birth, ID band Patient awake    Reviewed: Allergy & Precautions, H&P , NPO status , Patient's Chart, lab work & pertinent test results  History of Anesthesia Complications Negative for: history of anesthetic complications  Airway Mallampati: II  TM Distance: >3 FB Neck ROM: full    Dental no notable dental hx. (+) Poor Dentition, Missing, Chipped, Upper Dentures   Pulmonary neg shortness of breath, former smoker,    Pulmonary exam normal breath sounds clear to auscultation       Cardiovascular Exercise Tolerance: Good hypertension, (-) angina(-) Past MI and (-) DOE Normal cardiovascular exam Rhythm:regular Rate:Normal     Neuro/Psych negative neurological ROS  negative psych ROS   GI/Hepatic negative GI ROS, Neg liver ROS, neg GERD  ,  Endo/Other  diabetes, Type 2  Renal/GU      Musculoskeletal  (+) Arthritis ,   Abdominal   Peds  Hematology negative hematology ROS (+)   Anesthesia Other Findings  Signs and symptoms suggestive of sleep apnea   Past Medical History: No date: Arthritis No date: Diabetes mellitus without complication (HCC) No date: Hypertension  Past Surgical History: No date: NO PAST SURGERIES  BMI    Body Mass Index:  32.11 kg/m      Reproductive/Obstetrics negative OB ROS                             Anesthesia Physical Anesthesia Plan  ASA: III  Anesthesia Plan: Spinal   Post-op Pain Management:    Induction:   Airway Management Planned:   Additional Equipment:   Intra-op Plan:   Post-operative Plan:   Informed Consent: I have reviewed the patients History and Physical, chart, labs and discussed the procedure including the risks, benefits and alternatives for the proposed anesthesia with the patient or authorized representative who has indicated his/her understanding and acceptance.      Plan Discussed with: Anesthesiologist, CRNA and Surgeon  Anesthesia Plan Comments:         Anesthesia Quick Evaluation

## 2016-08-07 NOTE — Progress Notes (Signed)
PT Cancellation Note  Patient Details Name: Samuel HaddockWillie Hansen MRN: [id number] DOB: 12-30-1960   Cancelled Treatment:    Reason Eval/Treat Not Completed: Medical issues which prohibited therapy (pt with elevated BP).  Will hold PT until pt more medically appropriate for exertional activity.   Encarnacion ChuAshley Abashian PT, DPT 08/07/2016, 3:59 PM

## 2016-08-07 NOTE — Anesthesia Procedure Notes (Signed)
Date/Time: 08/07/2016 7:40 AM Performed by: Ginger CarneMICHELET, Raphael Fitzpatrick Pre-anesthesia Checklist: Patient identified, Emergency Drugs available, Suction available, Patient being monitored and Timeout performed Patient Re-evaluated:Patient Re-evaluated prior to inductionOxygen Delivery Method: Simple face mask

## 2016-08-07 NOTE — Anesthesia Procedure Notes (Signed)
Spinal  Patient location during procedure: OR Start time: 08/07/2016 7:23 AM End time: 08/07/2016 7:29 AM Staffing Anesthesiologist: Andria Frames Resident/CRNA: Johnna Acosta Performed: anesthesiologist  Preanesthetic Checklist Completed: patient identified, site marked, surgical consent, pre-op evaluation, timeout performed, IV checked, risks and benefits discussed and monitors and equipment checked Spinal Block Patient position: sitting Prep: Betadine and ChloraPrep Patient monitoring: heart rate, continuous pulse ox, blood pressure and cardiac monitor Approach: midline Location: L4-5 Injection technique: single-shot Needle Needle type: Whitacre and Introducer  Needle gauge: 24 G Needle length: 9 cm Assessment Sensory level: T10 Additional Notes First attempts by CRNA  Negative paresthesia. Negative blood return. Positive free-flowing CSF. Expiration date of kit checked and confirmed. Patient tolerated procedure well, without complications.

## 2016-08-07 NOTE — H&P (Signed)
The patient has been re-examined, and the chart reviewed, and there have been no interval changes to the documented history and physical.    The risks, benefits, and alternatives have been discussed at length. The patient expressed understanding of the risks benefits and agreed with plans for surgical intervention.  James P. Hooten, Jr. M.D.    

## 2016-08-07 NOTE — Brief Op Note (Signed)
08/07/2016  10:32 AM  PATIENT:  Samuel Hansen  55 y.o. male  PRE-OPERATIVE DIAGNOSIS:  primary osteoarthritis of the right hip  POST-OPERATIVE DIAGNOSIS:  Same  PROCEDURE:  Procedure(s): TOTAL HIP ARTHROPLASTY (Right)  SURGEON:  Surgeon(s) and Role:    * Donato HeinzJames P Adelaide Pfefferkorn, MD - Primary  ASSISTANTS: Van ClinesJon Wolfe, PA   ANESTHESIA:   spinal  EBL:  Total I/O In: 1700 [I.V.:1700] Out: 550 [Urine:150; Blood:400]  BLOOD ADMINISTERED:none  DRAINS: 2 medium Hemovac   LOCAL MEDICATIONS USED:  NONE  SPECIMEN:  Source of Specimen:  Right femoral head  DISPOSITION OF SPECIMEN:  PATHOLOGY  COUNTS:  YES  TOURNIQUET:  * No tourniquets in log *  DICTATION: .Dragon Dictation  PLAN OF CARE: Admit to inpatient   PATIENT DISPOSITION:  PACU - hemodynamically stable.   Delay start of Pharmacological VTE agent (>24hrs) due to surgical blood loss or risk of bleeding: yes

## 2016-08-08 LAB — CBC
HEMATOCRIT: 31.8 % — AB (ref 40.0–52.0)
Hemoglobin: 10.3 g/dL — ABNORMAL LOW (ref 13.0–18.0)
MCH: 27.5 pg (ref 26.0–34.0)
MCHC: 32.5 g/dL (ref 32.0–36.0)
MCV: 84.5 fL (ref 80.0–100.0)
PLATELETS: 340 10*3/uL (ref 150–440)
RBC: 3.76 MIL/uL — ABNORMAL LOW (ref 4.40–5.90)
RDW: 14.7 % — AB (ref 11.5–14.5)
WBC: 9.5 10*3/uL (ref 3.8–10.6)

## 2016-08-08 LAB — BASIC METABOLIC PANEL
Anion gap: 6 (ref 5–15)
BUN: 7 mg/dL (ref 6–20)
CALCIUM: 8.4 mg/dL — AB (ref 8.9–10.3)
CO2: 24 mmol/L (ref 22–32)
CREATININE: 1.05 mg/dL (ref 0.61–1.24)
Chloride: 107 mmol/L (ref 101–111)
GFR calc Af Amer: >60 mL/min (ref >60)
GLUCOSE: 124 mg/dL — AB (ref 65–99)
Potassium: 3.8 mmol/L (ref 3.5–5.1)
Sodium: 137 mmol/L (ref 135–145)

## 2016-08-08 LAB — GLUCOSE, CAPILLARY
Glucose-Capillary: 160 mg/dL — ABNORMAL HIGH (ref 65–99)
Glucose-Capillary: 111 mg/dL — ABNORMAL HIGH (ref 65–99)
Glucose-Capillary: 129 mg/dL — ABNORMAL HIGH (ref 65–99)
Glucose-Capillary: 127 mg/dL — ABNORMAL HIGH (ref 65–99)

## 2016-08-08 MED ORDER — TRAMADOL HCL 50 MG PO TABS: 50.0000 mg | ORAL_TABLET | ORAL | 1 refills | Status: DC | PRN

## 2016-08-08 MED ORDER — ENOXAPARIN SODIUM 40 MG/0.4ML ~~LOC~~ SOLN: 40.0000 mg | SUBCUTANEOUS | 0 refills | Status: DC

## 2016-08-08 MED ORDER — OXYCODONE HCL 5 MG PO TABS: 5.0000 mg | ORAL_TABLET | ORAL | 0 refills | Status: DC | PRN

## 2016-08-08 MED ORDER — LACTULOSE 10 GM/15ML PO SOLN
10.0000 g | Freq: Two times a day (BID) | ORAL | Status: DC | PRN
  Administered 2016-08-08 – 2016-08-09 (×2): 10 g via ORAL
  Filled 2016-08-08 (×2): qty 30

## 2016-08-08 NOTE — Progress Notes (Signed)
Pt requesting bed alarm to be turned off. Pt understands that he is a high risk for falls and states he will not get up on his own.

## 2016-08-08 NOTE — Progress Notes (Signed)
Clinical Social Worker (CSW) received SNF consult. PT is recommending home health. RN case manager is aware of above. Please reconsult if future social work needs arise. CSW signing off.   Sudais Banghart, LCSW [phone] 

## 2016-08-08 NOTE — Progress Notes (Signed)
Pt has met goals for inpatient physical therapy. Is voiding well. Pt still has not had a BM but has been given med's to meet that goal. Dr Marry Guan made aware of progress.

## 2016-08-08 NOTE — Discharge Instructions (Signed)
Total Hip Replacement, Care After Refer to this sheet in the next few weeks. These instructions provide you with information on caring for yourself after your procedure. Your health care provider may also give you specific instructions. Your treatment has been planned according to the most current medical practices, but problems sometimes occur. Call your health care provider if you have any problems or questions after your procedure. HOME CARE INSTRUCTIONS  Your health care provider will give you specific precautions for certain types of movement. Additional instructions include:  Take medicines only as directed by your health care provider.  Do not take baths, swim, or use a hot tub until your health care provider approves.  Avoid lifting until your health care provider instructs you otherwise.  Use a raised toilet seat and avoid sitting in low chairs as instructed by your health care provider.  Use crutches or a walker as instructed by your health care provider.  Rest often, but move around as much as you can tolerate. Movement helps you to heal and helps to prevent stiffness, skin sores, and blood clots.  Wear compression stockings as told by your health care provider. These stockings help to prevent blood clots and to reduce swelling in your legs.  Follow instructions from your health care provider about how to take care of your incision. Make sure you:  Wash your hands with soap and water before you change your bandage (dressing). If soap and water are not available, use hand sanitizer.  Change your dressing as told by your health care provider.  Leave stitches (sutures), skin glue, or adhesive strips in place. These skin closures may need to be in place for 2 weeks or longer. If adhesive strip edges start to loosen and curl up, you may trim the loose edges. Do not remove adhesive strips completely unless your health care provider tells you to do that. SEEK MEDICAL CARE IF:  You  have difficulty breathing.  You have drainage, redness, or swelling at your incision site.  You have a bad smell coming from your incision site.  You have persistent bleeding from your incision site.  Your incision breaks open after sutures (stitches) or staples have been removed.  You have a fever. SEEK IMMEDIATE MEDICAL CARE IF:   You have a rash.  You have pain or swelling in your calf or thigh.  You have shortness of breath or chest pain.   This information is not intended to replace advice given to you by your health care provider. Make sure you discuss any questions you have with your health care provider.   Document Released: 04/21/2005 Document Revised: 06/23/2015 Document Reviewed: 12/03/2013 Elsevier Interactive Patient Education 2016 [employer]   TOTAL KNEE REPLACEMENT POSTOPERATIVE DIRECTIONS  Knee Rehabilitation, Guidelines Following Surgery  Results after knee surgery are often greatly improved when you follow the exercise, range of motion and muscle strengthening exercises prescribed by your doctor. Safety measures are also important to protect the knee from further injury. Any time any of these exercises cause you to have increased pain or swelling in your knee joint, decrease the amount until you are comfortable again and slowly increase them. If you have problems or questions, call your caregiver or physical therapist for advice.   HOME CARE INSTRUCTIONS  Remove items at home which could result in a fall. This includes throw rugs or furniture in walking pathways.   Continue to use polar care unit on the knee for pain and swelling from surgery. You may  notice swelling that will progress down to the foot and ankle.  This is normal after surgery.  Elevate the leg when you are not up walking on it.    Continue to use the breathing machine which will help keep your temperature down.  It is common for your temperature to cycle up and down following surgery,  especially at night when you are not up moving around and exerting yourself.  The breathing machine keeps your lungs expanded and your temperature down.  Do not place pillow under knee, focus on keeping the knee STRAIGHT while resting  DIET You may resume your previous home diet once your are discharged from the hospital.  DRESSING / WOUND CARE / SHOWERING You may start showering once staples have been removed. Change the surgical dressing as needed.  STAPLE REMOVAL: Staples are to be removed at two weeks post op and apply benzoin and 1/2 inch steri strips  ACTIVITY Walk with your walker as instructed. Use walker as long as suggested by your caregivers. Avoid periods of inactivity such as sitting longer than an hour when not asleep. This helps prevent blood clots.  You may resume a sexual relationship in one month or when given the OK by your doctor.  You may return to work once you are cleared by your doctor.  Do not drive a car for 6 weeks or until released by you surgeon.  Do not drive while taking narcotics.  WEIGHT BEARING Weight bearing as tolerated with assist device (walker, cane, etc) as directed, use it as long as suggested by your surgeon or therapist, typically at least 4-6 weeks.  POSTOPERATIVE CONSTIPATION PROTOCOL Constipation - defined medically as fewer than three stools per week and severe constipation as less than one stool per week.  One of the most common issues patients have following surgery is constipation.  Even if you have a regular bowel pattern at home, your normal regimen is likely to be disrupted due to multiple reasons following surgery.  Combination of anesthesia, postoperative narcotics, change in appetite and fluid intake all can affect your bowels.  In order to avoid complications following surgery, here are some recommendations in order to help you during your recovery period.  Colace (docusate) - Pick up an over-the-counter form of Colace or another  stool softener and take twice a day as long as you are requiring postoperative pain medications.  Take with a full glass of water daily.  If you experience loose stools or diarrhea, hold the colace until you stool forms back up.  If your symptoms do not get better within 1 week or if they get worse, check with your doctor.  Dulcolax (bisacodyl) - Pick up over-the-counter and take as directed by the product packaging as needed to assist with the movement of your bowels.  Take with a full glass of water.  Use this product as needed if not relieved by Colace only.   MiraLax (polyethylene glycol) - Pick up over-the-counter to have on hand.  MiraLax is a solution that will increase the amount of water in your bowels to assist with bowel movements.  Take as directed and can mix with a glass of water, juice, soda, coffee, or tea.  Take if you go more than two days without a movement. Do not use MiraLax more than once per day. Call your doctor if you are still constipated or irregular after using this medication for 7 days in a row.  If you continue to have problems with  postoperative constipation, please contact the office for further assistance and recommendations.  If you experience "the worst abdominal pain ever" or develop nausea or vomiting, please contact the office immediatly for further recommendations for treatment.  ITCHING  If you experience itching with your medications, try taking only a single pain pill, or even half a pain pill at a time.  You can also use Benadryl over the counter for itching or also to help with sleep.   TED HOSE STOCKINGS Wear the elastic stockings on both legs for  6 weeks following surgery during the day but you may remove then at night for sleeping.  MEDICATIONS See your medication summary on the After Visit Summary that the nursing staff will review with you prior to discharge.  You may have some home medications which will be placed on hold until you complete the  course of blood thinner medication.  It is important for you to complete the blood thinner medication as prescribed by your surgeon.  Continue your approved medications as instructed at time of discharge.  PRECAUTIONS If you experience chest pain or shortness of breath - call 911 immediately for transfer to the hospital emergency department.  If you develop a fever greater that 101 F, purulent drainage from wound, increased redness or drainage from wound, foul odor from the wound/dressing, or calf pain - CONTACT YOUR SURGEON.                                                   FOLLOW-UP APPOINTMENTS Make sure you keep all of your appointments after your operation with your surgeon and caregivers. You should call the office at the above phone number and make an appointment for approximately two weeks after the date of your surgery or on the date instructed by your surgeon outlined in the "After Visit Summary".   RANGE OF MOTION AND STRENGTHENING EXERCISES  Rehabilitation of the knee is important following a knee injury or an operation. After just a few days of immobilization, the muscles of the thigh which control the knee become weakened and shrink (atrophy). Knee exercises are designed to build up the tone and strength of the thigh muscles and to improve knee motion. Often times heat used for twenty to thirty minutes before working out will loosen up your tissues and help with improving the range of motion but do not use heat for the first two weeks following surgery. These exercises can be done on a training (exercise) mat, on the floor, on a table or on a bed. Use what ever works the best and is most comfortable for you Knee exercises include:  Leg Lifts - While your knee is still immobilized in a splint or cast, you can do straight leg raises. Lift the leg to 60 degrees, hold for 3 sec, and slowly lower the leg. Repeat 10-20 times 2-3 times daily. Perform this exercise against resistance later as your  knee gets better.  Quad and Hamstring Sets - Tighten up the muscle on the front of the thigh (Quad) and hold for 5-10 sec. Repeat this 10-20 times hourly. Hamstring sets are done by pushing the foot backward against an object and holding for 5-10 sec. Repeat as with quad sets.   Leg Slides: Lying on your back, slowly slide your foot toward your buttocks, bending your knee up off the  floor (only go as far as is comfortable). Then slowly slide your foot back down until your leg is flat on the floor again.  Angel Wings: Lying on your back spread your legs to the side as far apart as you can without causing discomfort.  A rehabilitation program following serious knee injuries can speed recovery and prevent re-injury in the future due to weakened muscles. Contact your doctor or a physical therapist for more information on knee rehabilitation.   IF YOU ARE TRANSFERRED TO A SKILLED REHAB FACILITY If the patient is transferred to a skilled rehab facility following release from the hospital, a list of the current medications will be sent to the facility for the patient to continue.  When discharged from the skilled rehab facility, please have the facility set up the patient's Home Health Physical Therapy prior to being released. Also, the skilled facility will be responsible for providing the patient with their medications at time of release from the facility to include their pain medication, the muscle relaxants, and their blood thinner medication. If the patient is still at the rehab facility at time of the two week follow up appointment, the skilled rehab facility will also need to assist the patient in arranging follow up appointment in our office and any transportation needs.  MAKE SURE YOU:  Understand these instructions.  Get help right away if you are not doing well or get worse.

## 2016-08-08 NOTE — Evaluation (Signed)
Occupational Therapy Evaluation Patient Details Name: Samuel HaddockWillie Hansen MRN: [id number] DOB: 12-25-1960 Today's Date: 08/08/2016    History of Present Illness Pt. is a 55 y.o. male who was admitted for a RIght THA. Pt. has posterior precautions.   Clinical Impression   Pt. Is a 55 y.o male who was admitted for a Right THA. Pt. presents with pain, posterior hip precautions, weakness, and impaired functional mobility which hinder his ability to complete ADL, and IADL functioning. Pt. Could benefit from skilled OT services for ADL training, A/E training, UE the. Ex., posterior hip precautions, and pt. education about home modifications/DME. Pt. Plans to return home upon discharge.    Follow Up Recommendations    No follow-up OT services.   Equipment Recommendations       Recommendations for Other Services       Precautions / Restrictions Precautions Precautions: Posterior Hip Precaution Booklet Issued: Yes (comment) Restrictions Weight Bearing Restrictions: Yes RLE Weight Bearing: Weight bearing as tolerated              ADL Overall ADL's : Needs assistance/impaired Eating/Feeding: Set up   Grooming: Set up               Lower Body Dressing: Moderate assistance               Functional mobility during ADLs: Min guard General ADL Comments: Pt. education was provided about A/E use for LE ADLs.     Vision     Perception     Praxis      Pertinent Vitals/Pain Pain Assessment: 0-10 Pain Score: 5  Pain Location: Right Hip Pain Descriptors / Indicators: Sore Pain Intervention(s): Limited activity within patient's tolerance;Monitored during session;Premedicated before session     Hand Dominance Right   Extremity/Trunk Assessment Upper Extremity Assessment Upper Extremity Assessment: Overall WFL for tasks assessed          Communication Communication Communication: No difficulties   Cognition Arousal/Alertness: Awake/alert Behavior During  Therapy: WFL for tasks assessed/performed Overall Cognitive Status: Within Functional Limits for tasks assessed                     General Comments       Exercises       Shoulder Instructions      Home Living Family/patient expects to be discharged to:: Private residence Living Arrangements: Spouse/significant other Available Help at Discharge: Family Type of Home: House Home Access: Stairs to enter Secretary/administratorntrance Stairs-Number of Steps: 1 Entrance Stairs-Rails: Right Home Layout: Two level;Able to live on main level with bedroom/bathroom     Bathroom Shower/Tub: Chief Strategy OfficerTub/shower unit   Bathroom Toilet: Standard     Home Equipment: Environmental consultantWalker - 2 wheels;Toilet riser          Prior Functioning/Environment Level of Independence: Independent                 OT Problem List: Decreased strength;Decreased activity tolerance;Decreased knowledge of use of DME or AE;Pain   OT Treatment/Interventions:      OT Goals(Current goals can be found in the care plan section) Acute Rehab OT Goals Patient Stated Goal: To go home  OT Frequency:     Barriers to D/C:            Co-evaluation              End of Session    Activity Tolerance: Patient tolerated treatment well Patient left: with call bell/phone within reach;in chair;with chair alarm  set   Time: 0935-1000 OT Time Calculation (min): 25 min Charges:  OT General Charges $OT Visit: 1 Procedure OT Evaluation $OT Eval Moderate Complexity: 1 Procedure OT Treatments $Self Care/Home Management : 8-22 mins G-Codes:    Olegario Messier, MS, OTR/L 08/08/2016, 10:44 AM

## 2016-08-08 NOTE — Evaluation (Addendum)
Physical Therapy Evaluation Patient Details Name: Samuel HaddockWillie Bayne MRN: [id number] DOB: Nov 05, 1960 Today's Date: 08/08/2016   History of Present Illness  Pt is s/p R THA 08/07/16 posterior approach and is WBAT. No complications with the procedure.  Clinical Impression  Pt lives in a 2 story house with his spouse and has the ability to live on the main level. Pt has 1 step without rails to enter his home. At baseline pt is independent with all mobility and ADL's. At this time pt is able to transfer and ambulate 260 ft using a RW CGA. Pt educated on all posterior hip precautions with fair recall and will benefit from continued reinforcement (packet provided for reference). Pt completed all therapeutic exercises well. Pt will benefit from continued PT services in order to address above impairments and will benefit from stair training/bed mobility next session.     Follow Up Recommendations Home health PT    Equipment Recommendations  Rolling walker with 5" wheels (Pt owns RW)    Recommendations for Other Services       Precautions / Restrictions Precautions Precautions: Posterior Hip Precaution Booklet Issued: Yes (comment) Restrictions Weight Bearing Restrictions: Yes RLE Weight Bearing: Weight bearing as tolerated      Mobility  Bed Mobility               General bed mobility comments: Unable to assess; pt sitting in recliner upon PT arrival and at end of session  Transfers Overall transfer level: Needs assistance Equipment used: Rolling walker (2 wheeled) Transfers: Sit to/from Stand Sit to Stand: Min guard         General transfer comment: x2 trials; personal RW height corrected; Pt required min vc's to prevent >90 degree R hip flexion (needs some reinforcement); pt demonstrates good hand placement for transfer using RW  Ambulation/Gait Ambulation/Gait assistance: Min guard Ambulation Distance (Feet): 260 Feet Assistive device: Rolling walker (2 wheeled)     Gait  velocity interpretation: at or above normal speed for age/gender General Gait Details: pt initially demonstrating a step to pattern and throughout ambulation corrects to step through pattern; min vc's for standing posture and to slow speed to decrease antalgic gait pattern  Stairs            Wheelchair Mobility    Modified Rankin (Stroke Patients Only)       Balance Overall balance assessment: Needs assistance Sitting-balance support: Feet supported Sitting balance-Leahy Scale: Good Sitting balance - Comments: sitting edge of recliner with slight posterior lean (controlled) to prevent excessive R hip flexion     Standing balance-Leahy Scale: Good (using RW) Standing balance comment: No LOB                             Pertinent Vitals/Pain Pain Assessment: 0-10 Pain Score: 5  Pain Location: R hip Pain Descriptors / Indicators: Sore Pain Intervention(s): Limited activity within patient's tolerance;Monitored during session;Premedicated before session;Repositioned  Pt 02 on room air 97% throughout session. Pt HR 72-76 bpm throughout session.     Home Living Family/patient expects to be discharged to:: Private residence Living Arrangements: Spouse/significant other Available Help at Discharge: Family Type of Home: House Home Access: Stairs to enter Entrance Stairs-Rails: None Entrance Stairs-Number of Steps: 1 Home Layout: Two level;Able to live on main level with bedroom/bathroom Home Equipment: Dan HumphreysWalker - 2 wheels;Toilet riser      Prior Function Level of Independence: Independent  Hand Dominance   Dominant Hand: Right    Extremity/Trunk Assessment   Upper Extremity Assessment: Overall WFL for tasks assessed (at least 3/5 for all)           Lower Extremity Assessment: Overall WFL for tasks assessed (at least 3/5 for all; no knee buckling bilaterally)         Communication   Communication: No difficulties  Cognition  Arousal/Alertness: Awake/alert Behavior During Therapy: WFL for tasks assessed/performed Overall Cognitive Status: Within Functional Limits for tasks assessed                      General Comments General comments (skin integrity, edema, etc.): Pt is agreeable to PT session with wife at bedside.     Exercises Total Joint Exercises Ankle Circles/Pumps: AROM;Strengthening;Both;10 reps (semi-supine in recliner) Quad Sets: AROM;Strengthening;Both;10 reps (semi-supine in recliner) Gluteal Sets: AROM;Strengthening;Both;10 reps (semi-supine in recliner) Towel Squeeze: AROM;Strengthening;Both;10 reps (semi-supine in recliner; isometric pillow squeezes) Short Arc Quad: AROM;Strengthening;Right;10 reps (semi-supine in recliner) Heel Slides: AROM;Right;Strengthening;10 reps (semi-supine in recliner; min vc's to prevent excessive hip flexion and to complete with therapist only)   Assessment/Plan    PT Assessment Patient needs continued PT services  PT Problem List Decreased mobility;Decreased knowledge of precautions;Pain;Decreased strength          PT Treatment Interventions DME instruction;Gait training;Stair training;Therapeutic activities;Therapeutic exercise;Patient/family education    PT Goals (Current goals can be found in the Care Plan section)  Acute Rehab PT Goals Patient Stated Goal: To go home PT Goal Formulation: With patient Time For Goal Achievement: 08/22/16 Potential to Achieve Goals: Good    Frequency Min 2X/week   Barriers to discharge        Co-evaluation               End of Session Equipment Utilized During Treatment: Gait belt Activity Tolerance: Patient tolerated treatment well Patient left: in chair;with call bell/phone within reach;with chair alarm set;with family/visitor present (B heels suspended and B LE's seperated by pillows) Nurse Communication: Mobility status;Precautions;Weight bearing status         Time: 1610-9604 PT Time  Calculation (min) (ACUTE ONLY): 34 min   Charges:         PT G Codes:        Meagan Workman, SPT 08/08/2016, 9:25 AM

## 2016-08-08 NOTE — Progress Notes (Signed)
Physical Therapy Treatment Patient Details Name: Samuel HaddockWillie Hansen MRN: [id number] DOB: 1961-07-04 Today's Date: 08/08/2016    History of Present Illness Pt. is a 55 y.o. male who was admitted for a RIght THA. Pt. has posterior precautions,    PT Comments    Mr. Samuel Hansen is making excellent progress, ambulating 300 ft with a step through gait pattern.  He completed car transfer and stair training.  All of the pt's and pt's wife's questions were answered.  He tolerated all interventions well this date.  Follow Up Recommendations  Home health PT     Equipment Recommendations  None recommended by PT    Recommendations for Other Services       Precautions / Restrictions Precautions Precautions: Posterior Hip Precaution Comments: Pt was able to recall 3/3 hip precautions Restrictions Weight Bearing Restrictions: Yes RLE Weight Bearing: Weight bearing as tolerated    Mobility  Bed Mobility Overal bed mobility: Modified Independent;Needs Assistance Bed Mobility: Sit to Supine;Supine to Sit     Supine to sit: Min assist;HOB elevated Sit to supine: Min assist;HOB elevated   General bed mobility comments: Pt does not require min assist but provided min assist to simulate car transfer with HOB elevated to angle of reclined car chair.    Transfers Overall transfer level: Needs assistance Equipment used: Rolling walker (2 wheeled) Transfers: Sit to/from Stand Sit to Stand: Supervision         General transfer comment: Supervision for safety.  Pt able to recall proper technique to adhere to posterior precautions  Ambulation/Gait Ambulation/Gait assistance: Supervision Ambulation Distance (Feet): 300 Feet Assistive device: Rolling walker (2 wheeled) Gait Pattern/deviations: Step-through pattern;Decreased stride length;Antalgic;Step-to pattern   Gait velocity interpretation: at or above normal speed for age/gender General Gait Details: With cues to relax shoulders and gradually  increase WB through BLEs pt is able to transition to step through gait pattern.  Supervision for safety.   Stairs Stairs: Yes Stairs assistance: Min assist Stair Management: No rails;Backwards;With walker Number of Stairs: 1 General stair comments: Min assist to steady RW x1 with PT assisting and x1 with wife assisting.  Safe technique and pt and wife verbalized understanding.  Wheelchair Mobility    Modified Rankin (Stroke Patients Only)       Balance Overall balance assessment: Needs assistance Sitting-balance support: No upper extremity supported;Feet supported Sitting balance-Leahy Scale: Good     Standing balance support: Single extremity supported;During functional activity Standing balance-Leahy Scale: Fair Standing balance comment: Pt able to adjust gown with 1 UE supported on RW                    Cognition Arousal/Alertness: Awake/alert Behavior During Therapy: [facility] LouisWFL for tasks assessed/performed Overall Cognitive Status: Within Functional Limits for tasks assessed                      Exercises Total Joint Exercises Ankle Circles/Pumps: AROM;Both;10 reps;Seated Hip ABduction/ADduction: Strengthening;Right;15 reps;Standing Long Arc Quad: AROM;Right;10 reps;Seated Knee Flexion: AROM;Right;10 reps;Standing Marching in Standing: Both;10 reps;Standing (to ~45 deg R hip flexion)    General Comments        Pertinent Vitals/Pain Pain Assessment: No/denies pain Pain Score: 0-No pain Pain Intervention(s): Limited activity within patient's tolerance;Monitored during session    Home Living                      Prior Function            PT Goals (current  goals can now be found in the care plan section) Acute Rehab PT Goals Patient Stated Goal: To go home PT Goal Formulation: With patient Time For Goal Achievement: 08/22/16 Potential to Achieve Goals: Good Progress towards PT goals: Progressing toward goals    Frequency     BID      PT Plan Frequency needs to be updated    Co-evaluation             End of Session Equipment Utilized During Treatment: Gait belt Activity Tolerance: Patient tolerated treatment well Patient left: in chair;with call bell/phone within reach;with family/visitor present     Time: 1610-9604 PT Time Calculation (min) (ACUTE ONLY): 31 min  Charges:  $Gait Training: 8-22 mins $Therapeutic Exercise: 8-22 mins                    G Codes:       Encarnacion Chu PT, DPT 08/08/2016, 3:37 PM

## 2016-08-08 NOTE — Anesthesia Postprocedure Evaluation (Signed)
Anesthesia Post Note  Patient: Samuel Hansen  Procedure(s) Performed: Procedure(s) (LRB): TOTAL HIP ARTHROPLASTY (Right)  Patient location during evaluation: PACU Anesthesia Type: Spinal Level of consciousness: oriented and awake and alert Pain management: pain level controlled Vital Signs Assessment: post-procedure vital signs reviewed and stable Respiratory status: spontaneous breathing, respiratory function stable and patient connected to nasal cannula oxygen Cardiovascular status: blood pressure returned to baseline and stable Postop Assessment: no headache and no backache Anesthetic complications: no    Last Vitals:  Vitals:   08/08/16 0434 08/08/16 0559  BP: (!) 164/75   Pulse: 92   Resp: 16   Temp: (!) 38.1 C 37.1 C    Last Pain:  Vitals:   08/08/16 0559  TempSrc: Oral  PainSc:                  Starling Mannsurtis,  Weldon Nouri A

## 2016-08-08 NOTE — Care Management (Signed)
Cost of Lovenox is $ 10.00. Spouse updated.  

## 2016-08-08 NOTE — Discharge Summary (Signed)
Physician Discharge Summary  Patient ID: Samuel Hansen MRN: [id number] DOB/AGE: Jan 04, 1961 55 y.o.  Admit date: 08/07/2016 Discharge date: 08/09/2016  Admission Diagnoses:  primary osteoarthritis   Discharge Diagnoses: Patient Active Problem List   Diagnosis Date Noted  . Pure hypercholesterolemia 08/07/2016  . S/P total hip arthroplasty 08/07/2016  . Primary osteoarthritis of right hip 07/16/2016  . History of normocytic normochromic anemia 04/21/2016  . Type 2 diabetes mellitus without complication, without long-term current use of insulin (HCC) 04/21/2016    Past Medical History:  Diagnosis Date  . Arthritis   . Diabetes mellitus without complication (HCC)   . Hypertension      Transfusion: No transfusions given during this admission   Consultants (if any):  no consultations during this admission   Discharged Condition: Improved    Hospital Course: Samuel Hansen is an 55 y.o. male who was admitted 08/07/2016 with a diagnosis of degenerative arthrosis right hip and went to the operating room on 08/07/2016 and underwent the above named procedures.    Surgeries:Procedure(s): TOTAL HIP ARTHROPLASTY on 08/07/2016  PRE-OPERATIVE DIAGNOSIS: Degenerative arthrosis of the right hip, primary  POST-OPERATIVE DIAGNOSIS:  Same  PROCEDURE:  Right total hip arthroplasty  SURGEON:  Jena Gauss. M.D.  ASSISTANT:  Van Clines, PA (present and scrubbed throughout the case, critical for assistance with exposure, retraction, instrumentation, and closure)  ANESTHESIA: spinal  ESTIMATED BLOOD LOSS: 400 mL  FLUIDS REPLACED: 1600 mL of crystalloid  DRAINS: 2 medium drains to a Hemovac reservoir  IMPLANTS UTILIZED: DePuy 12 mm small stature AML femoral stem, 52 mm OD Pinnacle 100 acetabular component, neutral Pinnacle Altrx polyethylene insert, and a 36 mm M-SPEC +1.5 mm hip ball  INDICATIONS FOR SURGERY: Samuel Hansen is a 54 y.o. year old male with a long history  of progressive hip and groin  pain. X-rays demonstrated severe degenerative changes. The patient had not seen any significant improvement despite conservative nonsurgical intervention. After discussion of the risks and benefits of surgical intervention, the patient expressed understanding of the risks benefits and agree with plans for total hip arthroplasty.   The risks, benefits, and alternatives were discussed at length including but not limited to the risks of infection, bleeding, nerve injury, stiffness, blood clots, the need for revision surgery, limb length inequality, dislocation, cardiopulmonary complications, among others, and they were willing to proceed. Patient tolerated the surgery well. No complications .Patient was taken to PACU where she was stabilized and then transferred to the orthopedic floor.  Patient started on Lovenox 30 mg  q 12 hrs. Foot pumps applied bilaterally at 80 mm hgb. Heels elevated off bed with rolled towels. No evidence of DVT. Calves non tender. Negative Homan. Physical therapy started on day #1 for gait training and transfer with OT starting on  day #1 for ADL and assisted devices. Patient has done well with therapy. Ambulated greater than 300 feet upon being discharged. Was able to go up and down 4 steps independently and safely  Patient's IV and Foley were discontinued on day #1. Hemovac was discontinued on day #2 along with dressing change   He was given perioperative antibiotics:  Anti-infectives    Start     Dose/Rate Route Frequency Ordered Stop   08/07/16 1400  ceFAZolin (ANCEF) IVPB 2g/100 mL premix     2 g 200 mL/hr over 30 Minutes Intravenous Every 6 hours 08/07/16 1142 08/08/16 1359   08/07/16 0548  ceFAZolin (ANCEF) 2-4 GM/100ML-% IVPB    Comments:  Letta Pate:  cabinet override      08/07/16 0548 08/07/16 0800   08/07/16 0308  ceFAZolin (ANCEF) IVPB 2g/100 mL premix     2 g 200 mL/hr over 30 Minutes Intravenous On call to O.R. 08/07/16 0308  08/07/16 0815    .  He was fitted with AV 1 compression foot pump devices, instructed on heel pumps, early ambulation, and TED stockings bilaterally for DVT prophylaxis.  He benefited maximally from the hospital stay and there were no complications.    Recent vital signs:  Vitals:   08/08/16 0559 08/08/16 0725  BP:  (!) 143/75  Pulse:  72  Resp:    Temp: 98.8 F (37.1 C) 98.4 F (36.9 C)    Recent laboratory studies:  Lab Results  Component Value Date   HGB 10.3 (L) 08/08/2016   HGB 13.5 07/26/2016   Lab Results  Component Value Date   WBC 9.5 08/08/2016   PLT 340 08/08/2016   Lab Results  Component Value Date   INR 1.02 07/26/2016   Lab Results  Component Value Date   NA 137 08/08/2016   K 3.8 08/08/2016   CL 107 08/08/2016   CO2 24 08/08/2016   BUN 7 08/08/2016   CREATININE 1.05 08/08/2016   GLUCOSE 124 (H) 08/08/2016    Discharge Medications:     Medication List    TAKE these medications   acetaminophen 500 MG chewable tablet Commonly known as:  TYLENOL Chew 1,000 mg by mouth as needed for pain.   aspirin EC 81 MG tablet Take 81 mg by mouth daily at 12 noon.   atorvastatin 10 MG tablet Commonly known as:  LIPITOR Take 10 mg by mouth daily at 12 noon.   enoxaparin 40 MG/0.4ML injection Commonly known as:  LOVENOX Inject 0.4 mLs (40 mg total) into the skin daily.   losartan 100 MG tablet Commonly known as:  COZAAR Take 100 mg by mouth daily. noon   metFORMIN 500 MG tablet Commonly known as:  GLUCOPHAGE Take 500 mg by mouth daily with supper.   oxyCODONE 5 MG immediate release tablet Commonly known as:  Oxy IR/ROXICODONE Take 1-2 tablets (5-10 mg total) by mouth every 4 (four) hours as needed for severe pain.   traMADol 50 MG tablet Commonly known as:  ULTRAM Take 1-2 tablets (50-100 mg total) by mouth every 4 (four) hours as needed. What changed:  how much to take  when to take this       Diagnostic Studies: Dg Hip Port Unilat  With Pelvis 1v Right  Result Date: 08/07/2016 CLINICAL DATA:  Status post right total hip arthroplasty EXAM: DG HIP (WITH OR WITHOUT PELVIS) 1V PORT RIGHT COMPARISON:  12/28/2014 right hip radiograph FINDINGS: Interval right total hip arthroplasty with well-positioned right acetabular and right proximal femoral prostheses. No evidence of hardware fracture. No evidence of right hip dislocation. No acute osseous fracture. No suspicious focal osseous lesion. Skin staples are seen lateral to the right hip. Surgical drain overlies the right hip joint. IMPRESSION: Satisfactory immediate postoperative appearance status post right total hip arthroplasty. Electronically Signed   By: Delbert Phenix M.D.   On: 08/07/2016 11:26    Disposition: Final discharge disposition not confirmed  Discharge Instructions    Diet - low sodium heart healthy    Complete by:  As directed    Increase activity slowly    Complete by:  As directed       Follow-up Information    Donato Heinz, MD On  09/19/2016.   Specialty:  Orthopedic Surgery Why:  at 2:00pm Contact information: [address] [facility] [place] [address] [phone]            Signed: Tera PartridgeWOLFE,Abner Ardis R. 08/08/2016, 7:26 AM

## 2016-08-08 NOTE — Progress Notes (Addendum)
   Subjective: 1 Day Post-Op Procedure(s) (LRB): TOTAL HIP ARTHROPLASTY (Right) Patient reports pain as mild.   Patient is well, and has had no acute complaints or problems We will start therapy today. Patient states that he has already been up during the night and this morning walking. Patient states that he would like to go home today. Plan is to go Home after hospital stay. no nausea and no vomiting Patient denies any chest pains or shortness of breath. Objective: Vital signs in last 24 hours: Temp:  [96.9 F (36.1 C)-100.5 F (38.1 C)] 98.8 F (37.1 C) (10/24 0559) Pulse Rate:  [42-93] 92 (10/24 0434) Resp:  [13-18] 16 (10/24 0434) BP: (125-190)/(50-85) 164/75 (10/24 0434) SpO2:  [96 %-100 %] 96 % (10/24 0434) FiO2 (%):  [21 %] 21 % (10/23 1143) well approximated incision Heels are non tender and elevated off the bed using rolled towels Intake/Output from previous day: 10/23 0701 - 10/24 0700 In: 4166.7 [I.V.:3526.7; IV Piggyback:600] Out: 4800 [Urine:4180; Drains:220; Blood:400] Intake/Output this shift: No intake/output data recorded.   Recent Labs  08/08/16 0331  HGB 10.3*    Recent Labs  08/08/16 0331  WBC 9.5  RBC 3.76*  HCT 31.8*  PLT 340    Recent Labs  08/08/16 0331  NA 137  K 3.8  CL 107  CO2 24  BUN 7  CREATININE 1.05  GLUCOSE 124*  CALCIUM 8.4*   No results for input(s): LABPT, INR in the last 72 hours.  EXAM General - Patient is Alert, Appropriate and Oriented  Lungs: Clear to auscultation. Cardiac: Regular rate and rhythm with normal S1 and S2. No appreciable murmurs, gallops, or rubs. Abdomen: Soft, nontender, nondistended. Bowel sounds are present. Extremity - Neurologically intact Neurovascular intact Sensation intact distally Intact pulses distally Dorsiflexion/Plantar flexion intact Compartment soft  Homans test is negative. Dressing - dressing C/D/I Motor Function - intact, moving foot and toes well on exam.    Past  Medical History:  Diagnosis Date  . Arthritis   . Diabetes mellitus without complication (HCC)   . Hypertension     Assessment/Plan: 1 Day Post-Op Procedure(s) (LRB): TOTAL HIP ARTHROPLASTY (Right) Active Problems:   Primary osteoarthritis of right hip   S/P total hip arthroplasty Acute blood loss anemia secondary to surgery  Estimated body mass index is 32.11 kg/m as calculated from the following:   Height as of this encounter: 5\' 7"  (1.702 m).   Weight as of this encounter: 93 kg (205 lb). Advance diet Up with therapy D/C IV fluids Discharge home with home health. Possibly able to discharge this afternoon if not tomorrow.  Labs: Were reviewed DVT Prophylaxis - Lovenox, Foot Pumps and TED hose Weight-Bearing as tolerated to right leg D/C O2 and Pulse OX and try on Room Air Begin working on bowel movement Labs tomorrow a.m.  Lynnda ShieldsJon R. [facility] Center - SilvisWolfe PA [facility] 08/08/2016, 7:12 AM

## 2016-08-08 NOTE — Care Management (Signed)
CM consult for discharge planning. Met with wife, Samuel Hansen, at bedside. Patient is sleeping during assessment. Discussed OP PT with Samuel Hansen. Dr. Marry Guan to arrange. Instructed wife to call [facility] to follow up with an appointment date and time to begin OP PT. Patient has a walker and BSC. Dr. Marry Guan requested patient work with PT on getting in and out of car prior to discharge.  Notified Samuel Hansen [phone]) with PT of this specific request. Pharmacy: [facility] [phone]). Called Lovenox 40 mg # 14. No refills. Provided Samuel Hansen with RNCM contact information. It is anticipated that patient will discharge today or tomorrow.

## 2016-08-09 LAB — CBC
HCT: 30.9 % — ABNORMAL LOW (ref 40.0–52.0)
Hemoglobin: 10.4 g/dL — ABNORMAL LOW (ref 13.0–18.0)
MCH: 27.7 pg (ref 26.0–34.0)
MCHC: 33.6 g/dL (ref 32.0–36.0)
MCV: 82.6 fL (ref 80.0–100.0)
Platelets: 332 K/uL (ref 150–440)
RBC: 3.74 MIL/uL — ABNORMAL LOW (ref 4.40–5.90)
RDW: 14.5 % (ref 11.5–14.5)
WBC: 11.1 K/uL — ABNORMAL HIGH (ref 3.8–10.6)

## 2016-08-09 LAB — GLUCOSE, CAPILLARY: Glucose-Capillary: 129 mg/dL — ABNORMAL HIGH (ref 65–99)

## 2016-08-09 LAB — BASIC METABOLIC PANEL WITH GFR
Anion gap: 5 (ref 5–15)
BUN: 7 mg/dL (ref 6–20)
CO2: 24 mmol/L (ref 22–32)
Calcium: 8.9 mg/dL (ref 8.9–10.3)
Chloride: 105 mmol/L (ref 101–111)
Creatinine, Ser: 0.91 mg/dL (ref 0.61–1.24)
GFR calc Af Amer: >60 mL/min
GFR calc non Af Amer: >60 mL/min
Glucose, Bld: 117 mg/dL — ABNORMAL HIGH (ref 65–99)
Potassium: 4.1 mmol/L (ref 3.5–5.1)
Sodium: 134 mmol/L — ABNORMAL LOW (ref 135–145)

## 2016-08-09 NOTE — Progress Notes (Signed)
Clinical Child psychotherapistocial Worker (CSW) received consult for rehab. Plan is for patient to go home with home health. RN case Production designer, theatre/television/filmmanager and RN aware of above. Please reconsult if future social work needs arise. CSW signing off.   Baker Hughes IncorporatedBailey Teonia Yager, LCSW [phone]

## 2016-08-09 NOTE — Progress Notes (Signed)
   Subjective: 2 Days Post-Op Procedure(s) (LRB): TOTAL HIP ARTHROPLASTY (Right) Patient reports pain as 2 on 0-10 scale.   Patient is well, and has had no acute complaints or problems Continue with physical therapy today.  Plan is to go Home after hospital stay. no nausea and no vomiting Patient denies any chest pains or shortness of breath. Objective: Vital signs in last 24 hours: Temp:  [98.4 F (36.9 C)-100.8 F (38.2 C)] 99.3 F (37.4 C) (10/25 0505) Pulse Rate:  [72-91] 91 (10/25 0505) Resp:  [17-20] 19 (10/25 0505) BP: (137-163)/(66-77) 137/66 (10/25 0505) SpO2:  [96 %-99 %] 98 % (10/25 0505) well approximated incision Heels are non tender and elevated off the bed using rolled towels Intake/Output from previous day: 10/24 0701 - 10/25 0700 In: 1388.3 [P.O.:720; I.V.:568.3; IV Piggyback:100] Out: 895 [Urine:700; Drains:195] Intake/Output this shift: Total I/O In: -  Out: 75 [Drains:75]   Recent Labs  08/08/16 0331 08/09/16 0316  HGB 10.3* 10.4*    Recent Labs  08/08/16 0331 08/09/16 0316  WBC 9.5 11.1*  RBC 3.76* 3.74*  HCT 31.8* 30.9*  PLT 340 332    Recent Labs  08/08/16 0331 08/09/16 0316  NA 137 134*  K 3.8 4.1  CL 107 105  CO2 24 24  BUN 7 7  CREATININE 1.05 0.91  GLUCOSE 124* 117*  CALCIUM 8.4* 8.9   No results for input(s): LABPT, INR in the last 72 hours.  EXAM General - Patient is Alert, Appropriate and Oriented Extremity - Neurologically intact Neurovascular intact Sensation intact distally Intact pulses distally Dorsiflexion/Plantar flexion intact No cellulitis present Compartment soft Dressing - scant drainage Motor Function - intact, moving foot and toes well on exam.    Past Medical History:  Diagnosis Date  . Arthritis   . Diabetes mellitus without complication (HCC)   . Hypertension     Assessment/Plan: 2 Days Post-Op Procedure(s) (LRB): TOTAL HIP ARTHROPLASTY (Right) Active Problems:   Primary osteoarthritis  of right hip   S/P total hip arthroplasty  Estimated body mass index is 32.11 kg/m as calculated from the following:   Height as of this encounter: 5\' 7"  (1.702 m).   Weight as of this encounter: 93 kg (205 lb). Up with therapy Discharge home with home health  Labs: Reviewed DVT Prophylaxis - Lovenox, Foot Pumps and TED hose Weight-Bearing as tolerated to right leg Patient needs a bowel movement. Hemovac was discontinued on today's visit Change dressing prior to being discharged  Burlene Montecalvo R. [facility] HospitalWolfe PA [facility] 08/09/2016, 6:36 AM

## 2016-08-09 NOTE — Progress Notes (Signed)
Samuel HaddockWillie Hansen to be D/C'd Home per MD order.  Discussed with the patient and all questions fully answered.  VSS, Skin clean, dry and intact without evidence of skin break down, no evidence of skin tears noted. IV catheter discontinued intact. Site without signs and symptoms of complications. Dressing and pressure applied.  An After Visit Summary was printed and given to the patient. Patient received prescription.  D/c education completed with patient/family including follow up instructions, medication list, d/c activities limitations if indicated, with other d/c instructions as indicated by MD - patient able to verbalize understanding, all questions fully answered.   Patient instructed to return to ED, call 911, or call MD for any changes in condition.   Patient escorted via WC, and D/C home via private auto.  Harvie HeckMelanie Jenisa Monty 08/09/2016 9:24 AM

## 2016-08-09 NOTE — Progress Notes (Signed)
Physical Therapy Treatment Patient Details Name: Coby Shrewsberry MRN: [id number] DOB: 11-Dec-1960 Today's Date: 08/09/2016    History of Present Illness Pt. is a 55 y.o. male who was admitted for a RIght THA. Pt. has posterior precautions,    PT Comments    Pt up and dressed in recliner. Reports 8/10 pain in right hip without apparent distress. Pt notes a new pain medication prescription was written for pt. Adheres well with sit to/from stand transfer. Ambulation initially step to pattern; educated on reciprocal pattern with good correction/ability afterwards. Pt offered steps again, but pt feels comfortable with stair training. Education on practical application of posterior hip precautions during gait. Pt/family foresee no obstacles with return home and note safety measures taken already such as removal of throw rugs. Pt to be discharged home today.   Follow Up Recommendations  Home health PT     Equipment Recommendations  None recommended by PT    Recommendations for Other Services       Precautions / Restrictions Precautions Precautions: Posterior Hip Precaution Comments:  (Recites 2 of 3; 3rd w/ visual cue; Ed on application at home) Restrictions Weight Bearing Restrictions: Yes RLE Weight Bearing: Weight bearing as tolerated    Mobility  Bed Mobility               General bed mobility comments: Not tested; up in chair  Transfers Overall transfer level: Modified independent Equipment used: Rolling walker (2 wheeled) Transfers: Sit to/from Stand Sit to Stand: Modified independent (Device/Increase time)         General transfer comment: Requires no cueing; good safety awareness and adherence to posterior hip precautions  Ambulation/Gait Ambulation/Gait assistance: Supervision Ambulation Distance (Feet): 150 Feet Assistive device: Rolling walker (2 wheeled) Gait Pattern/deviations: Step-to pattern;Step-through pattern;Decreased stance time - right;Decreased step  length - left;Decreased weight shift to right (decreased hip/knee flexion R) Gait velocity: reduced Gait velocity interpretation: Below normal speed for age/gender General Gait Details: Initially step to pattern; pt educated with demo on proper reciprocal pattern and benefits. Pt demonstrates well post education   Stairs            Wheelchair Mobility    Modified Rankin (Stroke Patients Only)       Balance Overall balance assessment: Modified Independent                                  Cognition Arousal/Alertness: Awake/alert Behavior During Therapy: WFL for tasks assessed/performed Overall Cognitive Status: Within Functional Limits for tasks assessed                      Exercises      General Comments        Pertinent Vitals/Pain Pain Assessment: 0-10 Pain Score: 8  Pain Location: R hip Pain Intervention(s): Premedicated before session;Monitored during session    Home Living                      Prior Function            PT Goals (current goals can now be found in the care plan section) Progress towards PT goals: Progressing toward goals    Frequency    BID      PT Plan Current plan remains appropriate    Co-evaluation             End of Session   Activity Tolerance:  Patient tolerated treatment well Patient left: in chair     Time: [id number] PT Time Calculation (min) (ACUTE ONLY): 17 min  Charges:  $Gait Training: 8-22 mins                    G Codes:      Scot DockHeidi E Javonn Gauger, PTA 08/09/2016, 10:54 AM

## 2016-08-10 LAB — SURGICAL PATHOLOGY

## 2017-09-04 ENCOUNTER — Other Ambulatory Visit
Admission: RE | Admit: 2017-09-04 | Discharge: 2017-09-04 | Disposition: A | Discharge status: Home / Self Care | Payer: Managed Care, Other (non HMO) | Source: Ambulatory Visit | Attending: Family Medicine | Admitting: Family Medicine

## 2017-09-04 DIAGNOSIS — R0789 Other chest pain: Secondary | ICD-10-CM | POA: Diagnosis present

## 2017-09-04 DIAGNOSIS — Z0000 Encounter for general adult medical examination without abnormal findings: Secondary | ICD-10-CM | POA: Diagnosis present

## 2017-09-04 LAB — TROPONIN I: Troponin I: 0.03 ng/mL (ref <0.03)

## 2017-09-26 DIAGNOSIS — I5189 Other ill-defined heart diseases: Secondary | ICD-10-CM

## 2017-09-26 HISTORY — DX: Other ill-defined heart diseases: I51.89

## 2017-12-31 DIAGNOSIS — I2510 Atherosclerotic heart disease of native coronary artery without angina pectoris: Secondary | ICD-10-CM

## 2017-12-31 HISTORY — DX: Atherosclerotic heart disease of native coronary artery without angina pectoris: I25.10

## 2018-01-10 ENCOUNTER — Encounter
Admission: RE | Disposition: A | Discharge status: Home / Self Care | Payer: Self-pay | Source: Ambulatory Visit | Attending: Internal Medicine

## 2018-01-10 ENCOUNTER — Ambulatory Visit
Admission: RE | Admit: 2018-01-10 | Discharge: 2018-01-10 | Disposition: A | Discharge status: Home / Self Care | Payer: Managed Care, Other (non HMO) | Source: Ambulatory Visit | Attending: Internal Medicine | Admitting: Internal Medicine

## 2018-01-10 DIAGNOSIS — Z8619 Personal history of other infectious and parasitic diseases: Secondary | ICD-10-CM | POA: Diagnosis not present

## 2018-01-10 DIAGNOSIS — Z6832 Body mass index (BMI) 32.0-32.9, adult: Secondary | ICD-10-CM | POA: Diagnosis not present

## 2018-01-10 DIAGNOSIS — Z7984 Long term (current) use of oral hypoglycemic drugs: Secondary | ICD-10-CM | POA: Insufficient documentation

## 2018-01-10 DIAGNOSIS — E119 Type 2 diabetes mellitus without complications: Secondary | ICD-10-CM | POA: Insufficient documentation

## 2018-01-10 DIAGNOSIS — Z79899 Other long term (current) drug therapy: Secondary | ICD-10-CM | POA: Insufficient documentation

## 2018-01-10 DIAGNOSIS — I25110 Atherosclerotic heart disease of native coronary artery with unstable angina pectoris: Secondary | ICD-10-CM | POA: Insufficient documentation

## 2018-01-10 DIAGNOSIS — I10 Essential (primary) hypertension: Secondary | ICD-10-CM | POA: Insufficient documentation

## 2018-01-10 DIAGNOSIS — Z7982 Long term (current) use of aspirin: Secondary | ICD-10-CM | POA: Insufficient documentation

## 2018-01-10 DIAGNOSIS — Z87891 Personal history of nicotine dependence: Secondary | ICD-10-CM | POA: Insufficient documentation

## 2018-01-10 DIAGNOSIS — I2582 Chronic total occlusion of coronary artery: Secondary | ICD-10-CM | POA: Diagnosis not present

## 2018-01-10 DIAGNOSIS — Z8249 Family history of ischemic heart disease and other diseases of the circulatory system: Secondary | ICD-10-CM | POA: Insufficient documentation

## 2018-01-10 DIAGNOSIS — R079 Chest pain, unspecified: Secondary | ICD-10-CM

## 2018-01-10 DIAGNOSIS — E669 Obesity, unspecified: Secondary | ICD-10-CM | POA: Insufficient documentation

## 2018-01-10 DIAGNOSIS — Z888 Allergy status to other drugs, medicaments and biological substances status: Secondary | ICD-10-CM | POA: Diagnosis not present

## 2018-01-10 DIAGNOSIS — E785 Hyperlipidemia, unspecified: Secondary | ICD-10-CM | POA: Insufficient documentation

## 2018-01-10 DIAGNOSIS — I200 Unstable angina: Secondary | ICD-10-CM | POA: Diagnosis present

## 2018-01-10 HISTORY — PX: LEFT HEART CATH AND CORONARY ANGIOGRAPHY: CATH118249

## 2018-01-10 HISTORY — PX: CORONARY STENT INTERVENTION: CATH118234

## 2018-01-10 LAB — POCT ACTIVATED CLOTTING TIME: ACTIVATED CLOTTING TIME: 318 s

## 2018-01-10 LAB — CARDIAC CATHETERIZATION: Cath EF Quantitative: 55 %

## 2018-01-10 SURGERY — LEFT HEART CATH AND CORONARY ANGIOGRAPHY
Anesthesia: Moderate Sedation

## 2018-01-10 MED ORDER — ASPIRIN 81 MG PO CHEW
CHEWABLE_TABLET | ORAL | Status: DC | PRN
  Administered 2018-01-10: 243 mg via ORAL

## 2018-01-10 MED ORDER — SODIUM CHLORIDE 0.9 % IV SOLN
INTRAVENOUS | Status: DC | PRN
  Administered 2018-01-10: 1.75 mg/kg/h via INTRAVENOUS

## 2018-01-10 MED ORDER — ACETAMINOPHEN 325 MG PO TABS: 650.0000 mg | ORAL_TABLET | ORAL | Status: DC | PRN

## 2018-01-10 MED ORDER — SODIUM CHLORIDE 0.9 % WEIGHT BASED INFUSION
3.0000 mL/kg/h | INTRAVENOUS | Status: AC
  Administered 2018-01-10: 3 mL/kg/h via INTRAVENOUS

## 2018-01-10 MED ORDER — SODIUM CHLORIDE 0.9 % IV SOLN: 250.0000 mL | INTRAVENOUS | Status: DC | PRN

## 2018-01-10 MED ORDER — IOPAMIDOL (ISOVUE-300) INJECTION 61%
INTRAVENOUS | Status: DC | PRN
  Administered 2018-01-10: 185 mL via INTRA_ARTERIAL

## 2018-01-10 MED ORDER — SODIUM CHLORIDE 0.9% FLUSH: 3.0000 mL | Freq: Two times a day (BID) | INTRAVENOUS | Status: DC

## 2018-01-10 MED ORDER — SODIUM CHLORIDE 0.9 % WEIGHT BASED INFUSION: 1.0000 mL/kg/h | INTRAVENOUS | Status: DC

## 2018-01-10 MED ORDER — NITROGLYCERIN 5 MG/ML IV SOLN
INTRAVENOUS | Status: AC
  Filled 2018-01-10: qty 10

## 2018-01-10 MED ORDER — FENTANYL CITRATE (PF) 100 MCG/2ML IJ SOLN
INTRAMUSCULAR | Status: DC | PRN
  Administered 2018-01-10: 50 ug via INTRAVENOUS

## 2018-01-10 MED ORDER — BIVALIRUDIN TRIFLUOROACETATE 250 MG IV SOLR
INTRAVENOUS | Status: AC
  Filled 2018-01-10: qty 250

## 2018-01-10 MED ORDER — SODIUM CHLORIDE 0.9% FLUSH: 3.0000 mL | INTRAVENOUS | Status: DC | PRN

## 2018-01-10 MED ORDER — BIVALIRUDIN BOLUS VIA INFUSION - CUPID
INTRAVENOUS | Status: DC | PRN
  Administered 2018-01-10: 71.1 mg via INTRAVENOUS

## 2018-01-10 MED ORDER — ONDANSETRON HCL 4 MG/2ML IJ SOLN: 4.0000 mg | Freq: Four times a day (QID) | INTRAMUSCULAR | Status: DC | PRN

## 2018-01-10 MED ORDER — FENTANYL CITRATE (PF) 100 MCG/2ML IJ SOLN
INTRAMUSCULAR | Status: AC
  Filled 2018-01-10: qty 2

## 2018-01-10 MED ORDER — HEPARIN (PORCINE) IN NACL 2-0.9 UNIT/ML-% IJ SOLN
INTRAMUSCULAR | Status: AC
  Filled 2018-01-10: qty 500

## 2018-01-10 MED ORDER — ASPIRIN 81 MG PO CHEW
CHEWABLE_TABLET | ORAL | Status: AC
  Filled 2018-01-10: qty 3

## 2018-01-10 MED ORDER — MIDAZOLAM HCL 2 MG/2ML IJ SOLN
INTRAMUSCULAR | Status: AC
  Filled 2018-01-10: qty 2

## 2018-01-10 MED ORDER — NITROGLYCERIN 1 MG/10 ML FOR IR/CATH LAB
INTRA_ARTERIAL | Status: DC | PRN
  Administered 2018-01-10: 200 ug via INTRACORONARY

## 2018-01-10 MED ORDER — ASPIRIN 81 MG PO CHEW: 81.0000 mg | CHEWABLE_TABLET | ORAL | Status: DC

## 2018-01-10 MED ORDER — MIDAZOLAM HCL 2 MG/2ML IJ SOLN
INTRAMUSCULAR | Status: DC | PRN
  Administered 2018-01-10: 1 mg via INTRAVENOUS

## 2018-01-10 SURGICAL SUPPLY — 18 items
CATH INFINITI 5FR ANG PIGTAIL (CATHETERS) ×3 IMPLANT
CATH INFINITI 5FR JL4 (CATHETERS) ×3 IMPLANT
CATH INFINITI JR4 5F (CATHETERS) ×3 IMPLANT
CATH VISTA GUIDE 6FR JR4 (CATHETERS) ×3 IMPLANT
DEVICE CLOSURE MYNXGRIP 6/7F (Vascular Products) ×3 IMPLANT
DEVICE INFLAT 30 PLUS (MISCELLANEOUS) ×3 IMPLANT
GLIDESHEATH SLEND A-KIT 6F 22G (SHEATH) IMPLANT
GUIDEWIRE INQWIRE 1.5J.035X260 (WIRE) IMPLANT
INQWIRE 1.5J .035X260CM (WIRE)
KIT MANI 3VAL PERCEP (MISCELLANEOUS) ×3 IMPLANT
NEEDLE PERC 18GX7CM (NEEDLE) ×3 IMPLANT
PACK CARDIAC CATH (CUSTOM PROCEDURE TRAY) ×3 IMPLANT
SHEATH AVANTI 5FR X 11CM (SHEATH) ×3 IMPLANT
SHEATH AVANTI 6FR X 11CM (SHEATH) ×3 IMPLANT
WIRE G HI TQ BMW 190 (WIRE) ×3 IMPLANT
WIRE GUIDERIGHT .035X150 (WIRE) ×3 IMPLANT
WIRE HI TORQ WHISPER MS 190CM (WIRE) ×3 IMPLANT
WIRE INTUITION PROPEL ST 180CM (WIRE) ×3 IMPLANT

## 2018-01-10 NOTE — Discharge Instructions (Signed)

## 2018-01-11 ENCOUNTER — Encounter: Payer: Self-pay | Admitting: Internal Medicine

## 2018-01-30 HISTORY — PX: CORONARY ANGIOPLASTY WITH STENT PLACEMENT: SHX49

## 2018-06-21 HISTORY — PX: CORONARY ANGIOPLASTY WITH STENT PLACEMENT: SHX49

## 2020-07-23 ENCOUNTER — Other Ambulatory Visit: Payer: Self-pay | Admitting: Family Medicine

## 2020-07-23 ENCOUNTER — Other Ambulatory Visit (HOSPITAL_COMMUNITY): Payer: Self-pay | Admitting: Family Medicine

## 2020-07-23 DIAGNOSIS — M5416 Radiculopathy, lumbar region: Secondary | ICD-10-CM

## 2020-07-23 DIAGNOSIS — M5441 Lumbago with sciatica, right side: Secondary | ICD-10-CM

## 2020-07-23 DIAGNOSIS — M5442 Lumbago with sciatica, left side: Secondary | ICD-10-CM

## 2021-08-12 ENCOUNTER — Other Ambulatory Visit: Payer: Self-pay | Admitting: Family Medicine

## 2021-08-12 DIAGNOSIS — M5416 Radiculopathy, lumbar region: Secondary | ICD-10-CM

## 2021-08-22 ENCOUNTER — Ambulatory Visit
Admission: RE | Admit: 2021-08-22 | Discharge: 2021-08-22 | Disposition: A | Discharge status: Home / Self Care | Payer: Medicare Other | Source: Ambulatory Visit | Attending: Family Medicine | Admitting: Family Medicine

## 2021-08-22 ENCOUNTER — Other Ambulatory Visit: Payer: Self-pay

## 2021-08-22 DIAGNOSIS — M5416 Radiculopathy, lumbar region: Secondary | ICD-10-CM | POA: Insufficient documentation

## 2021-11-30 ENCOUNTER — Other Ambulatory Visit: Payer: Self-pay | Admitting: Neurosurgery

## 2021-11-30 DIAGNOSIS — Z01818 Encounter for other preprocedural examination: Secondary | ICD-10-CM

## 2021-12-05 ENCOUNTER — Encounter
Admission: RE | Admit: 2021-12-05 | Discharge: 2021-12-05 | Disposition: A | Discharge status: Home / Self Care | Payer: Medicare Other | Source: Ambulatory Visit | Attending: Neurosurgery | Admitting: Neurosurgery

## 2021-12-05 ENCOUNTER — Other Ambulatory Visit: Payer: Self-pay

## 2021-12-05 DIAGNOSIS — I10 Essential (primary) hypertension: Secondary | ICD-10-CM | POA: Insufficient documentation

## 2021-12-05 DIAGNOSIS — E118 Type 2 diabetes mellitus with unspecified complications: Secondary | ICD-10-CM | POA: Diagnosis not present

## 2021-12-05 DIAGNOSIS — Z01818 Encounter for other preprocedural examination: Secondary | ICD-10-CM | POA: Diagnosis present

## 2021-12-05 HISTORY — DX: Anemia, unspecified: D64.9

## 2021-12-05 HISTORY — DX: Gastro-esophageal reflux disease without esophagitis: K21.9

## 2021-12-05 HISTORY — DX: Cardiomegaly: I51.7

## 2021-12-05 HISTORY — DX: Atherosclerotic heart disease of native coronary artery without angina pectoris: I25.10

## 2021-12-05 HISTORY — DX: Pure hypercholesterolemia, unspecified: E78.00

## 2021-12-05 LAB — BASIC METABOLIC PANEL
Anion gap: 10 (ref 5–15)
BUN: 10 mg/dL (ref 8–23)
CO2: 24 mmol/L (ref 22–32)
Calcium: 9.6 mg/dL (ref 8.9–10.3)
Chloride: 104 mmol/L (ref 98–111)
Creatinine, Ser: 0.94 mg/dL (ref 0.61–1.24)
GFR, Estimated: >60 mL/min (ref >60)
Glucose, Bld: 88 mg/dL (ref 70–99)
Potassium: 3.9 mmol/L (ref 3.5–5.1)
Sodium: 138 mmol/L (ref 135–145)

## 2021-12-05 LAB — SURGICAL PCR SCREEN
MRSA, PCR: NEGATIVE
Staphylococcus aureus: NEGATIVE

## 2021-12-05 LAB — URINALYSIS, ROUTINE W REFLEX MICROSCOPIC
Bacteria, UA: NONE SEEN
Bilirubin Urine: NEGATIVE
Glucose, UA: NEGATIVE mg/dL
Hgb urine dipstick: NEGATIVE
Ketones, ur: NEGATIVE mg/dL
Leukocytes,Ua: NEGATIVE
Nitrite: NEGATIVE
Protein, ur: 30 mg/dL — AB
Specific Gravity, Urine: 1.024 (ref 1.005–1.030)
pH: 6 (ref 5.0–8.0)

## 2021-12-05 LAB — CBC
HCT: 41 % (ref 39.0–52.0)
Hemoglobin: 13 g/dL (ref 13.0–17.0)
MCH: 26.5 pg (ref 26.0–34.0)
MCHC: 31.7 g/dL (ref 30.0–36.0)
MCV: 83.5 fL (ref 80.0–100.0)
Platelets: 385 10*3/uL (ref 150–400)
RBC: 4.91 MIL/uL (ref 4.22–5.81)
RDW: 14.6 % (ref 11.5–15.5)
WBC: 7.9 10*3/uL (ref 4.0–10.5)
nRBC: 0 % (ref 0.0–0.2)

## 2021-12-05 LAB — TYPE AND SCREEN
ABO/RH(D): B POS
Antibody Screen: NEGATIVE

## 2021-12-05 LAB — APTT: aPTT: 33 seconds (ref 24–36)

## 2021-12-05 LAB — PROTIME-INR
INR: 1 (ref 0.8–1.2)
Prothrombin Time: 12.9 seconds (ref 11.4–15.2)

## 2021-12-05 NOTE — Patient Instructions (Addendum)
Your procedure is scheduled on:12-19-21 Monday Report to the Registration Desk on the 1st floor of the Medical Mall.Then proceed to the 2nd floor Surgery Desk in the Medical Mall To find out your arrival time, please call [phone] between 1PM - 3PM on:12-16-21 Friday  REMEMBER: Instructions that are not followed completely may result in serious medical risk, up to and including death; or upon the discretion of your surgeon and anesthesiologist your surgery may need to be rescheduled.  Do not eat food after midnight the night before surgery.  No gum chewing, lozengers or hard candies.  You may however, drink Water up to 2 hours before you are scheduled to arrive for your surgery. Do not drink anything within 2 hours of your scheduled arrival time.  Type 1 and Type 2 diabetics should only drink water.  Do NOT take any medication the day of surgery  Continue your aspirin EC 81 MG tablet as instructed by Dr Myer Haff up until the day prior to surgery-Do NOT take the morning of surgery  One week prior to surgery:(Last dose on 12-11-21 Sunday) Stop Anti-inflammatories (NSAIDS) such as Advil, Aleve, Ibuprofen, Motrin, Naproxen, Naprosyn and Aspirin based products such as Excedrin, Goodys Powder, BC Powder.You may however, continue to take Tylenol if needed for pain up until the day of surgery.  Stop ANY OVER THE COUNTER supplements/vitamins 7 days prior to surgery (Super Beets and Apple Cider Vinegar)  No Alcohol for 24 hours before or after surgery.  No Smoking including e-cigarettes for 24 hours prior to surgery.  No chewable tobacco products for at least 6 hours prior to surgery.  No nicotine patches on the day of surgery.  Do not use any "recreational" drugs for at least a week prior to your surgery.  Please be advised that the combination of cocaine and anesthesia may have negative outcomes, up to and including death. If you test positive for cocaine, your surgery will be  cancelled.  On the morning of surgery brush your teeth with toothpaste and water, you may rinse your mouth with mouthwash if you wish. Do not swallow any toothpaste or mouthwash.  Use CHG Soap as directed on instruction sheet.  Do not wear jewelry, make-up, hairpins, clips or nail polish.  Do not wear lotions, powders, or perfumes.   Do not shave body from the neck down 48 hours prior to surgery just in case you cut yourself which could leave a site for infection.  Also, freshly shaved skin may become irritated if using the CHG soap.  Contact lenses, hearing aids and dentures may not be worn into surgery.  Do not bring valuables to the hospital. [facility] is not responsible for any missing/lost belongings or valuables.  Notify your doctor if there is any change in your medical condition (cold, fever, infection).  Wear comfortable clothing (specific to your surgery type) to the hospital.  After surgery, you can help prevent lung complications by doing breathing exercises.  Take deep breaths and cough every 1-2 hours. Your doctor may order a device called an Incentive Spirometer to help you take deep breaths. When coughing or sneezing, hold a pillow firmly against your incision with both hands. This is called splinting. Doing this helps protect your incision. It also decreases belly discomfort.  If you are being admitted to the hospital overnight, leave your suitcase in the car. After surgery it may be brought to your room.  If you are being discharged the day of surgery, you will not be  allowed to drive home. You will need a responsible adult (18 years or older) to drive you home and stay with you that night.   If you are taking public transportation, you will need to have a responsible adult (18 years or older) with you. Please confirm with your physician that it is acceptable to use public transportation.   Please call the Pre-admissions Testing Dept. at [phone] if you  have any questions about these instructions.  Surgery Visitation Policy:  Patients undergoing a surgery or procedure may have one family member or support person with them as long as that person is not COVID-19 positive or experiencing its symptoms.  That person may remain in the waiting area during the procedure and may rotate out with other people.  Inpatient Visitation:    Visiting hours are 7 a.m. to 8 p.m. Up to two visitors ages 16+ are allowed at one time in a patient room. The visitors may rotate out with other people during the day. Visitors must check out when they leave, or other visitors will not be allowed. One designated support person may remain overnight. The visitor must pass COVID-19 screenings, use hand sanitizer when entering and exiting the patients room and wear a mask at all times, including in the patients room. Patients must also wear a mask when staff or their visitor are in the room. Masking is required regardless of vaccination status.

## 2021-12-15 ENCOUNTER — Encounter: Payer: Self-pay | Admitting: Neurosurgery

## 2021-12-15 NOTE — Progress Notes (Signed)
Perioperative Services  Pre-Admission/Anesthesia Testing Clinical Review  Date: 12/15/21  Patient Demographics:  Name: Samuel Hansen DOB:   [birth year] MRN:   [id number]  Planned Surgical Procedure(s):    Case: [id number] Date/Time: 12/19/21 0700   Procedure: LEFT L3-4 MICRODISCECTOMY, L4-5 DECOMPRESSION   Anesthesia type: General   Pre-op diagnosis:      Lumbar radiculopathy M54.16     Neurogenic claudication due to lumbar spinal stenosis M48.062   Location: [facility] OR ROOM 03 / [facility]   Surgeons: Venetia NightYarbrough, [place], MD   NOTE: Available PAT nursing documentation and vital signs have been reviewed. Clinical nursing staff has updated patient's PMH/PSHx, current medication list, and drug allergies/intolerances to ensure comprehensive history available to assist in medical decision making as it pertains to the aforementioned surgical procedure and anticipated anesthetic course. Extensive review of available clinical information performed. [place] PMH and PSHx updated with any diagnoses/procedures that  may have been inadvertently omitted during his intake with the pre-admission testing department's nursing staff.  Clinical Discussion:  Samuel Hansen is a 61 y.o. male who is submitted for pre-surgical anesthesia review and clearance prior to him undergoing the above procedure. Patient is a Former Smoker (40 pack years; quit 07/2010). Pertinent PMH includes: CAD, LVH, diastolic dysfunction, HTN, HLD, T2DM, anemia, OA, spinal stenosis with associated neurogenic claudication, lumbar radiculopathy.  Patient is followed by cardiology (Crawford/Paraschos, MD). He was last seen in the cardiology clinic on 12/14/2021; notes reviewed. At the time of his clinic visit, the patient denied any chest pain, shortness of breath, PND, orthopnea, palpitations, significant peripheral edema, vertiginous symptoms, or presyncope/syncope.  Patient with a PMH significant for cardiovascular  diagnoses.  Myocardial perfusion imaging study performed on 09/26/2017 revealed a normal left ventricular systolic function with an EF of 55%.  There were no regional wall motion abnormalities.  SPECT imaging revealed small perfusion abnormality resting in the inferior basal region on stress images.  There was no evidence of reversible ischemia.    TTE performed on 09/26/2017 revealed a normal left ventricular systolic function with EF of >55%.  There was mild LVH.  Trivial mitral, tricuspid, and pulmonary valve regurgitation noted.  Diastolic Doppler parameters demonstrated impaired relaxation (G1DD).  There was no evidence of a significant transvalvular gradient to suggest mitral or aortic valve stenosis.  Patient has undergone multiple cardiac catheterizations as follows:  Diagnostic left heart catheterization performed on 01/10/2018 revealing a normal left ventricular systolic function with an EF of 55%.  There was multivessel CAD; 100% mid RCA, 25% ostial proximal LAD, 25% ostial first septal-first septal, 25% ostial to proximal LCx, and 25% ostial OM1-OM1.  Unable to cross CTO in the RCA with wire, thus patient was [facility] intervention.  Diagnostic left heart catheterization was performed on 01/25/2018 revealing multivessel CAD; 40% mid to distal LAD, subtotal occlusion of the distal RCA, and 70% stenosis of the proximal RCA.  PCI was performed placing multiple overlapping Synergy drug-eluting stents; 2.25 x 38 mm proximal RCA, 3.5 x 15 mm proximal RCA, 3.0 x 33 mm mid RCA, and 2.25 x 38 mm distal RCA.  Last cardiac catheterization was performed on 06/21/2018 revealing multivessel CAD; 80% mid PDA, 30% mid LCx with insignificant iFR of 0.98.There was mild LAD disease. RCA noted to be widely patent with no ISR of previously placed stents. PCI performed placing a 2.25 x 12 mm Synergy DES mid PDA.  Most recent myocardial perfusion imaging study revealed a low normal left  ventricular systolic function with an EF of 49%.  There were no regional wall motion abnormalities.  SPECT imaging revealed a moderate mixed perfusion abnormality present in the inferior region on stress images.  This finding was consistent with inferior scar with ischemia.  Left ventricular cavity size noted to be enlarged.  Blood pressure elevated at 184/90.  Patient was not on any type of prescribed antihypertensive or medications for his HLD diagnosis and further ASCVD prevention. T2DM well-controlled on currently prescribed regimen; last HgbA1c was 6.5% when checked on 11/29/2021.  Patient reporting sedentary lifestyle; no formal exercise regimen.  Additionally, recent activity decreased due to lower back pain with radiculopathy.  Despite the aforementioned, patient still felt to be able to achieve at least 4 METS of activity without angina/anginal equivalent symptoms.  Given his elevated blood pressure, patient was started on metoprolol succinate 50 mg daily.  No other changes were made to his medication regimen.  Patient to follow-up with outpatient cardiology in 3 months or sooner if needed.  Samuel Hansen is scheduled for an LEFT L3-4 MICRODISCECTOMY, L4-5 DECOMPRESSION on 12/19/2021 with Dr. Venetia Night, MD.  Given patient's past medical history significant for cardiovascular diagnoses, presurgical cardiac clearance was sought by the performing surgeon's office and PAT team. Per cardiology, "this patient is optimized for surgery and may proceed with the planned procedural course with an ACCEPTABLE risk of significant perioperative cardiovascular complications". In review of his medication reconciliation, it is noted the patient is on daily antiplatelet therapy.  He has been instructed on recommendations from neurosurgery for continuing his daily low-dose ASA throughout the perioperative course, holding his morning dose on the day of his procedure.  Patient denies previous perioperative  complications with anesthesia in the past. In review of the available records, it is noted that patient underwent a neuraxial anesthetic course here (ASA III) in 07/2016 without documented complications.   Vitals with BMI 12/05/2021 01/10/2018 01/10/2018  Height 5\' 7"  - -  Weight 199 lbs 8 oz - -  BMI 31.24 - -  Systolic 154 150 -  Diastolic 84 83 -  Pulse 87 60 58    Providers/Specialists:   NOTE: Primary physician provider listed below. Patient may have been seen by APP or partner within same practice.   PROVIDER ROLE / SPECIALTY LAST , MD Neurosurgery (Surgeon) 11/29/2021  12/01/2021, MD Primary Care Provider 08/12/2021   08/14/2021, MD Cardiology 11/10/2019  11/12/2019, MD Cardiology 12/14/2021  02/13/2022, MD Physiatry 11/02/2021   Allergies:  Lisinopril  Current Home Medications:   No current facility-administered medications for this encounter.    metoprolol succinate (TOPROL-XL) 50 MG 24 hr tablet   acetaminophen (TYLENOL) 500 MG chewable tablet   APPLE CIDER VINEGAR PO   aspirin EC 81 MG tablet   HYDROcodone-acetaminophen (NORCO/VICODIN) 5-325 MG tablet   methocarbamol (ROBAXIN) 500 MG tablet   OVER THE COUNTER MEDICATION   History:   Past Medical History:  Diagnosis Date   Anemia    Arthritis    Coronary artery disease 12/31/2017   a.) LHC 01/10/18: 100% mRCA, 25% o-pLAD, 25% oS1-S1, 25% o-pLCx, 25% oOM1-OM1; ref to [facility] for complex PCI. b.) LHC 01/25/18: 40% m-dLAD, subtotal occ dRCA, 70% pRCA -> PCI placing overlapping Synergy DES x 4 -> 2.25x26mm pRCA, 2.5x21mm pRCA, 3.0x7mm mRCA, 2.25x58mm dRCA. c.) LHC 07/01/18: widely patent RCA, 80% mPDA, 30% mLCx (iFR 0.98), mild LAD Dz -> PCI placing a 2.25x64mm Synergy DES to mPDA   Diastolic  dysfunction 09/26/2017   a.)  TTE 09/26/2017: EF >55%; mild LVH; trivial MR/TR/PR; G1DD.   GERD (gastroesophageal reflux disease)    History of heart artery stent    a.) TOTAL  # stents = 5 (Synergy DES) --> 2.25 x 38 mm pRCA, 2.5 x 15 mm pRCA, 3.0 x 33 mm mRCA, 2.25 x 38 mm dRCA, 2.25 x 12 mm mPDA   Hypercholesteremia    Hypertension    Lumbar radiculopathy    LVH (left ventricular hypertrophy)    Spinal stenosis of lumbar region with neurogenic claudication    T2DM (type 2 diabetes mellitus) (HCC)    Past Surgical History:  Procedure Laterality Date   CORONARY ANGIOPLASTY WITH STENT PLACEMENT Left 01/30/2018   Procedure: CORONARY ANGIOPLASTY WITH STENT PLACEMENT; Location: [facility]; Surgeon: Lovie Chol, MD   CORONARY ANGIOPLASTY WITH STENT PLACEMENT Left 06/21/2018   Procedure: CORONARY ANGIOPLASTY WITH STENT PLACEMENT; Location: [facility]; Surgeon: Lovie Chol, MD   CORONARY STENT INTERVENTION N/A 01/10/2018   Procedure: CORONARY STENT INTERVENTION;  Surgeon: Alwyn Pea, MD;  Location: [facility] INVASIVE CV LAB;  Service: Cardiovascular;  Laterality: N/A;   LEFT HEART CATH AND CORONARY ANGIOGRAPHY N/A 01/10/2018   Procedure: LEFT HEART CATH AND CORONARY ANGIOGRAPHY;  Surgeon: Alwyn Pea, MD;  Location: [facility] INVASIVE CV LAB;  Service: Cardiovascular;  Laterality: N/A;   TOTAL HIP ARTHROPLASTY Right 08/07/2016   Procedure: TOTAL HIP ARTHROPLASTY;  Surgeon: Donato Heinz, MD;  Location: [facility] ORS;  Service: Orthopedics;  Laterality: Right;   No family history on file. Social History   Tobacco Use   Smoking status: Former    Packs/day: 1.00    Years: 40.00    Pack years: 40.00    Types: Cigarettes    Quit date: 07/26/2010    Years since quitting: 11.3   Smokeless tobacco: Never  Vaping Use   Vaping Use: Never used  Substance Use Topics   Alcohol use: Yes    Alcohol/week: 6.0 standard drinks    Types: 6 Cans of beer per week    Comment: occ   Drug use: Not Currently    Pertinent Clinical Results:  LABS: Labs reviewed: Acceptable for surgery.  No visits with results within 3 Day(s) from this visit.  Latest known visit with results  is:  Hospital Outpatient Visit on 12/05/2021  Component Date Value Ref Range Status   aPTT 12/05/2021 33  24 - 36 seconds Final   Performed at [facility] [address] [place], [place] [address]   Sodium 12/05/2021 138  135 - 145 mmol/L Final   Potassium 12/05/2021 3.9  3.5 - 5.1 mmol/L Final   Chloride 12/05/2021 104  98 - 111 mmol/L Final   CO2 12/05/2021 24  22 - 32 mmol/L Final   Glucose, Bld 12/05/2021 88  70 - 99 mg/dL Final   Glucose reference range applies only to samples taken after fasting for at least 8 hours.   BUN 12/05/2021 10  8 - 23 mg/dL Final   Creatinine, Ser 12/05/2021 0.94  0.61 - 1.24 mg/dL Final   Calcium 75/91/6384 9.6  8.9 - 10.3 mg/dL Final   GFR, Estimated 12/05/2021 >60  >60 mL/min Final   Comment: (NOTE) Calculated using the CKD-EPI Creatinine Equation (2021)    Anion gap 12/05/2021 10  5 - 15 Final   Performed at [facility], [address] [place], [place] [address]   WBC 12/05/2021 7.9  4.0 - 10.5 K/uL Final   RBC  12/05/2021 4.91  4.22 - 5.81 MIL/uL Final   Hemoglobin 12/05/2021 13.0  13.0 - 17.0 g/dL Final   HCT 54/09/811902/20/2023 41.0  39.0 - 52.0 % Final   MCV 12/05/2021 83.5  80.0 - 100.0 fL Final   MCH 12/05/2021 26.5  26.0 - 34.0 pg Final   MCHC 12/05/2021 31.7  30.0 - 36.0 g/dL Final   RDW 14/78/295602/20/2023 14.6  11.5 - 15.5 % Final   Platelets 12/05/2021 385  150 - 400 K/uL Final   nRBC 12/05/2021 0.0  0.0 - 0.2 % Final   Performed at [facility], [address] [place], [place] [address]   Prothrombin Time 12/05/2021 12.9  11.4 - 15.2 seconds Final   INR 12/05/2021 1.0  0.8 - 1.2 Final   Comment: (NOTE) INR goal varies based on device and disease states. Performed at [facility], [address] [place], [place] [address]    MRSA, PCR 12/05/2021 NEGATIVE  NEGATIVE Final   Staphylococcus aureus 12/05/2021 NEGATIVE  NEGATIVE Final   Comment: (NOTE) The Xpert SA Assay (FDA approved for NASAL specimens in  patients 63 years of age and older), is one component of a comprehensive surveillance program. It is not intended to diagnose infection nor to guide or monitor treatment. Performed at [facility], [address] [place], [place] [address]    ABO/RH(D) 12/05/2021 B POS   Final   Antibody Screen 12/05/2021 NEG   Final   Sample Expiration 12/05/2021 12/19/2021,2359   Final   Extend sample reason 12/05/2021    Final                   Value:NO TRANSFUSIONS OR PREGNANCY IN THE PAST 3 MONTHS Performed at [facility], [address] [place], [place] [address]    Color, Urine 12/05/2021 YELLOW (A)  YELLOW Final   APPearance 12/05/2021 CLEAR (A)  CLEAR Final   Specific Gravity, Urine 12/05/2021 1.024  1.005 - 1.030 Final   pH 12/05/2021 6.0  5.0 - 8.0 Final   Glucose, UA 12/05/2021 NEGATIVE  NEGATIVE mg/dL Final   Hgb urine dipstick 12/05/2021 NEGATIVE  NEGATIVE Final   Bilirubin Urine 12/05/2021 NEGATIVE  NEGATIVE Final   Ketones, ur 12/05/2021 NEGATIVE  NEGATIVE mg/dL Final   Protein, ur 32/44/010202/20/2023 30 (A)  NEGATIVE mg/dL Final   Nitrite 72/53/664402/20/2023 NEGATIVE  NEGATIVE Final   Leukocytes,Ua 12/05/2021 NEGATIVE  NEGATIVE Final   RBC / HPF 12/05/2021 0-5  0 - 5 RBC/hpf Final   WBC, UA 12/05/2021 0-5  0 - 5 WBC/hpf Final   Bacteria, UA 12/05/2021 NONE SEEN  NONE SEEN Final   Squamous Epithelial / LPF 12/05/2021 0-5  0 - 5 Final   Mucus 12/05/2021 PRESENT   Final   Performed at [facility], [address] [place], [place] [address]    ECG: Date: 12/05/2021 Time ECG obtained: 1409 PM Rate: 86 bpm Rhythm:  Normal sinus rhythm with sinus arrhythmia; LVH Axis (leads I and aVF): Normal Intervals: PR 134 ms. QRS 76 ms. QTc 406 ms. ST segment and T wave changes: Nonspecific T wave abnormality  Comparison: Similar to previous tracing obtained on 11/04/2019   IMAGING / PROCEDURES: MYOCARDIAL PERFUSION IMAGING STUDY (LEXISCAN) performed on 12/12/2021 LVEF  49% Mildly reduced left ventricular systolic function Normal myocardial thickening and wall motion No artifacts noted Left ventricular cavity size enlarged SPECT images demonstrate moderate mixed perfusion abnormality of moderate intensity present in the inferior regions on stress images consistent  with inferior scar with ischemia.  LEFT HEART CATHETERIZATION AND CORONARY ANGIOGRAPHY performed on 03/21/2018 Dominant RCA that was stented in April of this year and has nicely patent stents from the PDA to mid vessel There is focal 80% stenosis in the mid PDA 30% mid circumflex stenosis with iFR of 0.98 Mild disease in the proximal LAD Successful PCI 2.25 x 12 mm Synergy DES x1 to the mid PDA.  TRANSTHORACIC ECHOCARDIOGRAM performed on 09/26/2017 LVEF >55% Diastolic Doppler parameters consistent with impaired relaxation (G1DD). 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 Moderate spinal canal stenosis with mild narrowing of the bilateral subarticular zones and mild bilateral neural foraminal narrowing at L4-5. Moderate bilateral neural foraminal narrowing at L5-S1  Impression and Plan:  Samuel Hansen has been referred for pre-anesthesia review and clearance prior to him undergoing the planned anesthetic and procedural courses. Available labs, pertinent testing, and imaging results were personally reviewed by me. This patient has been appropriately cleared by cardiology with an overall ACCEPTABLE risk of significant perioperative cardiovascular complications.  Based on clinical review performed today (12/15/21), barring any significant acute changes in the patient's  overall condition, it is anticipated that he will be able to proceed with the planned surgical intervention. Any acute changes in clinical condition may necessitate his procedure being postponed and/or cancelled. Patient will meet with anesthesia team (MD and/or CRNA) on the day of his procedure for preoperative evaluation/assessment. Questions regarding anesthetic course will be fielded at that time.   Pre-surgical instructions were reviewed with the patient during his PAT appointment and questions were fielded by PAT clinical staff. Patient was advised that if any questions or concerns arise prior to his procedure then he should return a call to PAT and/or his surgeon's office to discuss.  Quentin Mulling, MSN, APRN, FNP-C, CEN [facility]  Peri-operative Services Nurse Practitioner Phone: [phone] Fax: [phone] 12/15/21 9:48 AM  NOTE: This note has been prepared using Dragon dictation software. Despite my best ability to proofread, there is always the potential that unintentional transcriptional errors may still occur from this process.

## 2021-12-19 ENCOUNTER — Ambulatory Visit: Payer: Medicare Other | Admitting: Urgent Care

## 2021-12-19 ENCOUNTER — Encounter: Payer: Self-pay | Admitting: Neurosurgery

## 2021-12-19 ENCOUNTER — Encounter
Admission: RE | Disposition: A | Discharge status: Home / Self Care | Payer: Self-pay | Source: Home / Self Care | Attending: Neurosurgery

## 2021-12-19 ENCOUNTER — Other Ambulatory Visit: Payer: Self-pay

## 2021-12-19 ENCOUNTER — Ambulatory Visit
Admission: RE | Admit: 2021-12-19 | Discharge: 2021-12-19 | Disposition: A | Discharge status: Home / Self Care | Payer: Medicare Other | Attending: Neurosurgery | Admitting: Neurosurgery

## 2021-12-19 ENCOUNTER — Ambulatory Visit: Payer: Medicare Other

## 2021-12-19 DIAGNOSIS — I10 Essential (primary) hypertension: Secondary | ICD-10-CM | POA: Diagnosis not present

## 2021-12-19 DIAGNOSIS — M1990 Unspecified osteoarthritis, unspecified site: Secondary | ICD-10-CM | POA: Diagnosis not present

## 2021-12-19 DIAGNOSIS — Z87891 Personal history of nicotine dependence: Secondary | ICD-10-CM | POA: Diagnosis not present

## 2021-12-19 DIAGNOSIS — I2510 Atherosclerotic heart disease of native coronary artery without angina pectoris: Secondary | ICD-10-CM | POA: Diagnosis not present

## 2021-12-19 DIAGNOSIS — M5416 Radiculopathy, lumbar region: Secondary | ICD-10-CM | POA: Diagnosis not present

## 2021-12-19 DIAGNOSIS — E7800 Pure hypercholesterolemia, unspecified: Secondary | ICD-10-CM | POA: Diagnosis not present

## 2021-12-19 DIAGNOSIS — E119 Type 2 diabetes mellitus without complications: Secondary | ICD-10-CM | POA: Diagnosis not present

## 2021-12-19 DIAGNOSIS — Z419 Encounter for procedure for purposes other than remedying health state, unspecified: Secondary | ICD-10-CM

## 2021-12-19 DIAGNOSIS — M48062 Spinal stenosis, lumbar region with neurogenic claudication: Secondary | ICD-10-CM | POA: Diagnosis not present

## 2021-12-19 HISTORY — DX: Radiculopathy, lumbar region: M54.16

## 2021-12-19 HISTORY — DX: Type 2 diabetes mellitus without complications: E11.9

## 2021-12-19 HISTORY — DX: Spinal stenosis, lumbar region with neurogenic claudication: M48.062

## 2021-12-19 HISTORY — PX: LUMBAR LAMINECTOMY/DECOMPRESSION MICRODISCECTOMY: SHX5026

## 2021-12-19 HISTORY — DX: Presence of coronary angioplasty implant and graft: Z95.5

## 2021-12-19 LAB — GLUCOSE, CAPILLARY
Glucose-Capillary: 104 mg/dL — ABNORMAL HIGH (ref 70–99)
Glucose-Capillary: 99 mg/dL (ref 70–99)

## 2021-12-19 SURGERY — LUMBAR LAMINECTOMY/DECOMPRESSION MICRODISCECTOMY 2 LEVELS
Anesthesia: General | Site: Spine Lumbar

## 2021-12-19 MED ORDER — ACETAMINOPHEN 10 MG/ML IV SOLN
INTRAVENOUS | Status: AC
  Filled 2021-12-19: qty 100

## 2021-12-19 MED ORDER — 0.9 % SODIUM CHLORIDE (POUR BTL) OPTIME
TOPICAL | Status: DC | PRN
  Administered 2021-12-19: 500 mL

## 2021-12-19 MED ORDER — PHENYLEPHRINE HCL (PRESSORS) 10 MG/ML IV SOLN
INTRAVENOUS | Status: AC
  Filled 2021-12-19: qty 1

## 2021-12-19 MED ORDER — CHLORHEXIDINE GLUCONATE 0.12 % MT SOLN
OROMUCOSAL | Status: AC
  Administered 2021-12-19: 15 mL via OROMUCOSAL
  Filled 2021-12-19: qty 15

## 2021-12-19 MED ORDER — PHENYLEPHRINE HCL (PRESSORS) 10 MG/ML IV SOLN
INTRAVENOUS | Status: DC | PRN
  Administered 2021-12-19: 160 ug via INTRAVENOUS
  Administered 2021-12-19: 80 ug via INTRAVENOUS
  Administered 2021-12-19 (×2): 160 ug via INTRAVENOUS
  Administered 2021-12-19: 80 ug via INTRAVENOUS

## 2021-12-19 MED ORDER — METHYLPREDNISOLONE ACETATE 40 MG/ML IJ SUSP
INTRAMUSCULAR | Status: DC | PRN
  Administered 2021-12-19: 40 mg

## 2021-12-19 MED ORDER — SURGIFLO WITH THROMBIN (HEMOSTATIC MATRIX KIT) OPTIME
TOPICAL | Status: DC | PRN
Start: 2021-12-19 — End: 2021-12-19
  Administered 2021-12-19: 1 via TOPICAL

## 2021-12-19 MED ORDER — GLYCOPYRROLATE 0.2 MG/ML IJ SOLN
INTRAMUSCULAR | Status: DC | PRN
  Administered 2021-12-19: .2 mg via INTRAVENOUS

## 2021-12-19 MED ORDER — METHYLPREDNISOLONE ACETATE 40 MG/ML IJ SUSP
INTRAMUSCULAR | Status: AC
  Filled 2021-12-19: qty 1

## 2021-12-19 MED ORDER — ONDANSETRON HCL 4 MG/2ML IJ SOLN
INTRAMUSCULAR | Status: DC | PRN
  Administered 2021-12-19: 4 mg via INTRAVENOUS

## 2021-12-19 MED ORDER — SODIUM CHLORIDE (PF) 0.9 % IJ SOLN
INTRAMUSCULAR | Status: AC
  Filled 2021-12-19: qty 20

## 2021-12-19 MED ORDER — CHLORHEXIDINE GLUCONATE 0.12 % MT SOLN: 15.0000 mL | Freq: Once | OROMUCOSAL | Status: AC

## 2021-12-19 MED ORDER — SODIUM CHLORIDE 0.9 % IV SOLN
INTRAVENOUS | Status: DC
Start: 2021-12-19 — End: 2021-12-19

## 2021-12-19 MED ORDER — SUCCINYLCHOLINE CHLORIDE 200 MG/10ML IV SOSY
PREFILLED_SYRINGE | INTRAVENOUS | Status: AC
  Filled 2021-12-19: qty 10

## 2021-12-19 MED ORDER — SENNA 8.6 MG PO TABS
1.0000 | ORAL_TABLET | Freq: Every day | ORAL | 0 refills | Status: DC | PRN
Start: 2021-12-19 — End: 2022-04-12

## 2021-12-19 MED ORDER — REMIFENTANIL HCL 1 MG IV SOLR
INTRAVENOUS | Status: DC | PRN
  Administered 2021-12-19: .1 ug/kg/min via INTRAVENOUS

## 2021-12-19 MED ORDER — FAMOTIDINE 20 MG PO TABS
ORAL_TABLET | ORAL | Status: AC
  Administered 2021-12-19: 20 mg via ORAL
  Filled 2021-12-19: qty 1

## 2021-12-19 MED ORDER — PHENYLEPHRINE HCL-NACL 20-0.9 MG/250ML-% IV SOLN
INTRAVENOUS | Status: DC | PRN
  Administered 2021-12-19: 24 ug/min via INTRAVENOUS

## 2021-12-19 MED ORDER — BUPIVACAINE LIPOSOME 1.3 % IJ SUSP
INTRAMUSCULAR | Status: AC
  Filled 2021-12-19: qty 20

## 2021-12-19 MED ORDER — LIDOCAINE HCL (PF) 2 % IJ SOLN
INTRAMUSCULAR | Status: AC
  Filled 2021-12-19: qty 5

## 2021-12-19 MED ORDER — PROPOFOL 10 MG/ML IV BOLUS
INTRAVENOUS | Status: AC
  Filled 2021-12-19: qty 20

## 2021-12-19 MED ORDER — FAMOTIDINE 20 MG PO TABS: 20.0000 mg | ORAL_TABLET | Freq: Once | ORAL | Status: AC

## 2021-12-19 MED ORDER — LIDOCAINE HCL (CARDIAC) PF 100 MG/5ML IV SOSY
PREFILLED_SYRINGE | INTRAVENOUS | Status: DC | PRN
Start: 2021-12-19 — End: 2021-12-19
  Administered 2021-12-19: 100 mg via INTRAVENOUS

## 2021-12-19 MED ORDER — ACETAMINOPHEN 10 MG/ML IV SOLN
INTRAVENOUS | Status: DC | PRN
  Administered 2021-12-19: 1000 mg via INTRAVENOUS

## 2021-12-19 MED ORDER — CEFAZOLIN SODIUM-DEXTROSE 2-4 GM/100ML-% IV SOLN
2.0000 g | INTRAVENOUS | Status: AC
  Administered 2021-12-19: 2 g via INTRAVENOUS

## 2021-12-19 MED ORDER — FENTANYL CITRATE (PF) 100 MCG/2ML IJ SOLN: 25.0000 ug | INTRAMUSCULAR | Status: DC | PRN

## 2021-12-19 MED ORDER — EPHEDRINE 5 MG/ML INJ
INTRAVENOUS | Status: AC
  Filled 2021-12-19: qty 5

## 2021-12-19 MED ORDER — FENTANYL CITRATE (PF) 100 MCG/2ML IJ SOLN
INTRAMUSCULAR | Status: AC
Start: 2021-12-19 — End: ?
  Filled 2021-12-19: qty 2

## 2021-12-19 MED ORDER — BUPIVACAINE HCL (PF) 0.5 % IJ SOLN
INTRAMUSCULAR | Status: AC
  Filled 2021-12-19: qty 30

## 2021-12-19 MED ORDER — SODIUM CHLORIDE FLUSH 0.9 % IV SOLN
INTRAVENOUS | Status: AC
  Filled 2021-12-19: qty 20

## 2021-12-19 MED ORDER — BUPIVACAINE-EPINEPHRINE (PF) 0.5% -1:200000 IJ SOLN
INTRAMUSCULAR | Status: AC
  Filled 2021-12-19: qty 30

## 2021-12-19 MED ORDER — REMIFENTANIL HCL 1 MG IV SOLR
INTRAVENOUS | Status: AC
  Filled 2021-12-19: qty 1000

## 2021-12-19 MED ORDER — EPHEDRINE SULFATE (PRESSORS) 50 MG/ML IJ SOLN
INTRAMUSCULAR | Status: DC | PRN
  Administered 2021-12-19 (×3): 10 mg via INTRAVENOUS

## 2021-12-19 MED ORDER — SODIUM CHLORIDE (PF) 0.9 % IJ SOLN
INTRAMUSCULAR | Status: DC | PRN
  Administered 2021-12-19: 60 mL

## 2021-12-19 MED ORDER — DEXAMETHASONE SODIUM PHOSPHATE 10 MG/ML IJ SOLN
INTRAMUSCULAR | Status: AC
  Filled 2021-12-19: qty 1

## 2021-12-19 MED ORDER — BUPIVACAINE-EPINEPHRINE (PF) 0.5% -1:200000 IJ SOLN
INTRAMUSCULAR | Status: DC | PRN
  Administered 2021-12-19: 4 mL

## 2021-12-19 MED ORDER — GLYCOPYRROLATE 0.2 MG/ML IJ SOLN
INTRAMUSCULAR | Status: AC
Start: 2021-12-19 — End: ?
  Filled 2021-12-19: qty 1

## 2021-12-19 MED ORDER — DEXAMETHASONE SODIUM PHOSPHATE 10 MG/ML IJ SOLN
INTRAMUSCULAR | Status: DC | PRN
Start: 2021-12-19 — End: 2021-12-19
  Administered 2021-12-19: 10 mg via INTRAVENOUS

## 2021-12-19 MED ORDER — SUCCINYLCHOLINE CHLORIDE 200 MG/10ML IV SOSY
PREFILLED_SYRINGE | INTRAVENOUS | Status: DC | PRN
  Administered 2021-12-19: 100 mg via INTRAVENOUS

## 2021-12-19 MED ORDER — PROPOFOL 10 MG/ML IV BOLUS
INTRAVENOUS | Status: DC | PRN
Start: 2021-12-19 — End: 2021-12-19
  Administered 2021-12-19: 150 mg via INTRAVENOUS

## 2021-12-19 MED ORDER — ONDANSETRON HCL 4 MG/2ML IJ SOLN
INTRAMUSCULAR | Status: AC
  Filled 2021-12-19: qty 2

## 2021-12-19 MED ORDER — OXYCODONE-ACETAMINOPHEN 7.5-325 MG PO TABS: 1.0000 | ORAL_TABLET | ORAL | 0 refills | Status: AC | PRN

## 2021-12-19 MED ORDER — ONDANSETRON HCL 4 MG/2ML IJ SOLN: 4.0000 mg | Freq: Once | INTRAMUSCULAR | Status: DC | PRN

## 2021-12-19 MED ORDER — FENTANYL CITRATE (PF) 100 MCG/2ML IJ SOLN
INTRAMUSCULAR | Status: DC | PRN
  Administered 2021-12-19 (×2): 50 ug via INTRAVENOUS

## 2021-12-19 MED ORDER — METHOCARBAMOL 500 MG PO TABS: 500.0000 mg | ORAL_TABLET | Freq: Four times a day (QID) | ORAL | 0 refills | Status: DC

## 2021-12-19 MED ORDER — MIDAZOLAM HCL 2 MG/2ML IJ SOLN
INTRAMUSCULAR | Status: DC | PRN
Start: 2021-12-19 — End: 2021-12-19
  Administered 2021-12-19: 2 mg via INTRAVENOUS

## 2021-12-19 MED ORDER — MIDAZOLAM HCL 2 MG/2ML IJ SOLN
INTRAMUSCULAR | Status: AC
  Filled 2021-12-19: qty 2

## 2021-12-19 MED ORDER — CEFAZOLIN SODIUM-DEXTROSE 2-4 GM/100ML-% IV SOLN
INTRAVENOUS | Status: AC
  Filled 2021-12-19: qty 100

## 2021-12-19 MED ORDER — ORAL CARE MOUTH RINSE: 15.0000 mL | Freq: Once | OROMUCOSAL | Status: AC

## 2021-12-19 SURGICAL SUPPLY — 53 items
ADH SKN CLS APL DERMABOND .7 (GAUZE/BANDAGES/DRESSINGS) ×1
AGENT HMST KT MTR STRL THRMB (HEMOSTASIS) ×1
APL PRP STRL LF DISP 70% ISPRP (MISCELLANEOUS) ×1
BUR NEURO DRILL SOFT 3.0X3.8M (BURR) ×2 IMPLANT
CHLORAPREP W/TINT 26 (MISCELLANEOUS) ×2 IMPLANT
CNTNR SPEC 2.5X3XGRAD LEK (MISCELLANEOUS) ×1
CONT SPEC 4OZ STER OR WHT (MISCELLANEOUS) ×1
CONT SPEC 4OZ STRL OR WHT (MISCELLANEOUS) ×1
CONTAINER SPEC 2.5X3XGRAD LEK (MISCELLANEOUS) ×1 IMPLANT
COUNTER NEEDLE 20/40 LG (NEEDLE) ×2 IMPLANT
CUP MEDICINE 2OZ PLAST GRAD ST (MISCELLANEOUS) ×2 IMPLANT
DERMABOND ADVANCED (GAUZE/BANDAGES/DRESSINGS) ×1
DERMABOND ADVANCED .7 DNX12 (GAUZE/BANDAGES/DRESSINGS) ×1 IMPLANT
DRAPE C ARM PK CFD 31 SPINE (DRAPES) ×2 IMPLANT
DRAPE LAPAROTOMY 100X77 ABD (DRAPES) ×2 IMPLANT
DRAPE MICROSCOPE SPINE 48X150 (DRAPES) ×2 IMPLANT
DRAPE SURG 17X11 SM STRL (DRAPES) ×8 IMPLANT
DRSG OPSITE POSTOP 3X4 (GAUZE/BANDAGES/DRESSINGS) ×1 IMPLANT
ELECT CAUTERY BLADE TIP 2.5 (TIP) ×2
ELECT EZSTD 165MM 6.5IN (MISCELLANEOUS) ×2
ELECT REM PT RETURN 9FT ADLT (ELECTROSURGICAL) ×2
ELECTRODE CAUTERY BLDE TIP 2.5 (TIP) ×1 IMPLANT
ELECTRODE EZSTD 165MM 6.5IN (MISCELLANEOUS) ×1 IMPLANT
ELECTRODE REM PT RTRN 9FT ADLT (ELECTROSURGICAL) ×1 IMPLANT
GAUZE 4X4 16PLY ~~LOC~~+RFID DBL (SPONGE) ×2 IMPLANT
GLOVE SURG SYN 6.5 ES PF (GLOVE) ×2 IMPLANT
GLOVE SURG SYN 6.5 PF PI (GLOVE) ×1 IMPLANT
GLOVE SURG SYN 8.5  E (GLOVE) ×3
GLOVE SURG SYN 8.5 E (GLOVE) ×3 IMPLANT
GLOVE SURG SYN 8.5 PF PI (GLOVE) ×3 IMPLANT
GLOVE SURG UNDER POLY LF SZ6.5 (GLOVE) ×2 IMPLANT
GOWN SRG LRG LVL 4 IMPRV REINF (GOWNS) ×1 IMPLANT
GOWN SRG XL LVL 3 NONREINFORCE (GOWNS) ×1 IMPLANT
GOWN STRL NON-REIN TWL XL LVL3 (GOWNS) ×2
GOWN STRL REIN LRG LVL4 (GOWNS) ×2
GRADUATE 1200CC STRL 31836 (MISCELLANEOUS) ×2 IMPLANT
KIT SPINAL PRONEVIEW (KITS) ×2 IMPLANT
MANIFOLD NEPTUNE II (INSTRUMENTS) ×2 IMPLANT
MARKER SKIN DUAL TIP RULER LAB (MISCELLANEOUS) ×4 IMPLANT
NDL SAFETY ECLIPSE 18X1.5 (NEEDLE) ×1 IMPLANT
NEEDLE HYPO 18GX1.5 SHARP (NEEDLE) ×2
NEEDLE HYPO 22GX1.5 SAFETY (NEEDLE) ×2 IMPLANT
NS IRRIG 500ML POUR BTL (IV SOLUTION) ×1 IMPLANT
PACK LAMINECTOMY NEURO (CUSTOM PROCEDURE TRAY) ×2 IMPLANT
SURGIFLO W/THROMBIN 8M KIT (HEMOSTASIS) ×2 IMPLANT
SUT DVC VLOC 3-0 CL 6 P-12 (SUTURE) ×2 IMPLANT
SUT VIC AB 0 CT1 27 (SUTURE) ×2
SUT VIC AB 0 CT1 27XCR 8 STRN (SUTURE) ×1 IMPLANT
SUT VIC AB 2-0 CT1 18 (SUTURE) ×2 IMPLANT
SYR 30ML LL (SYRINGE) ×4 IMPLANT
SYR 3ML LL SCALE MARK (SYRINGE) ×2 IMPLANT
TOWEL OR 17X26 4PK STRL BLUE (TOWEL DISPOSABLE) ×6 IMPLANT
TUBING CONNECTING 10 (TUBING) ×2 IMPLANT

## 2021-12-19 NOTE — Op Note (Signed)
Indications: Mr. Capellan is a 61 yo male who presented with Lumbar radiculopathy M54.16, Neurogenic claudication due to lumbar spinal stenosis M48.062. ? ?He failed conservative management prompting surgical intervention. ? ?Findings: large disc herniation L3-4 ? ?Preoperative Diagnosis: Lumbar radiculopathy M54.16, Neurogenic claudication due to lumbar spinal stenosis M48.062 ?Postoperative Diagnosis: same ? ? ?EBL: 10 ml ?IVF: 1200 ml ?Drains: none ?Disposition: Extubated and Stable to PACU ?Complications: none ? ?No foley catheter was placed. ? ? ?Preoperative Note:  ? ?Risks of surgery discussed include: infection, bleeding, stroke, coma, death, paralysis, CSF leak, nerve/spinal cord injury, numbness, tingling, weakness, complex regional pain syndrome, recurrent stenosis and/or disc herniation, vascular injury, development of instability, neck/back pain, need for further surgery, persistent symptoms, development of deformity, and the risks of anesthesia. The patient understood these risks and agreed to proceed. ? ?Operative Note:  ? ?1. L4-5 lumbar decompression including central laminectomy and bilateral medial facetectomies including foraminotomies ?2. L L3-4 discectomy ? ?The patient was then brought from the preoperative center with intravenous access established.  The patient underwent general anesthesia and endotracheal tube intubation, and was then rotated on the [place] rail top where all pressure points were appropriately padded.  The skin was then thoroughly cleansed.  Perioperative antibiotic prophylaxis was administered.  Sterile prep and drapes were then applied and a timeout was then observed.  C-arm was brought into the field under sterile conditions and under lateral visualization the L4-5 interspace was identified and marked. ? ?The incision was marked and injected with local anesthetic. Once this was complete a 4 cm incision was opened with the use of a #10 blade knife.   ? ?The metrx tubes were  sequentially advanced and confirmed in position at L4-5. An 17mm by 20mm tube was locked in place to the bed side attachment.  The microscope was then sterilely brought into the field and muscle creep was hemostased with a bipolar and resected with a pituitary rongeur.  A Bovie extender was then used to expose the spinous process and lamina.  Careful attention was placed to not violate the facet capsule. A 3 mm matchstick drill bit was then used to make a hemi-laminotomy trough until the ligamentum flavum was exposed.  This was extended to the base of the spinous process and to the contralateral side to remove all the central bone from each side.  Once this was complete and the underlying ligamentum flavum was visualized, it was dissected with a curette and resected with Kerrison rongeurs.  Extensive ligamentum hypertrophy was noted, requiring a substantial amount of time and care for removal.  The dura was identified and palpated. The kerrison rongeur was then used to remove the medial facet bilaterally until no compression was noted.  A balltip probe was used to confirm decompression of the ipsilateral L5 nerve root. ? ?Additional attention was paid to completion of the contralateral L4-5 foraminotomy until the contralateral traversing nerve root was completely free.  Once this was complete, L4-5 central decompression including medial facetectomy and foraminotomy was confirmed and decompression on both sides was confirmed. No CSF leak was noted. ? ?A Depo-Medrol soaked Gelfoam pledget was placed in the defect.  The wound was copiously irrigated. The tube system was then removed under microscopic visualization and hemostasis was obtained with a bipolar.   ? ?After performing the decompression at L4-5, the metrx tubes were sequentially advanced and confirmed in position at L3-4. An 6mm by 69mm tube was locked in place to the bed side attachment.  Fluoroscopy was  then removed from the field.  The microscope was then  sterilely brought into the field and muscle creep was hemostased with a bipolar and resected with a pituitary rongeur.  A Bovie extender was then used to expose the spinous process and lamina.  Careful attention was placed to not violate the facet capsule. A 3 mm matchstick drill bit was then used to make a hemi-laminotomy trough until the ligamentum flavum was exposed.  This was extended to the base of the spinous process and to the contralateral side to remove all the central bone from each side.  Once this was complete and the underlying ligamentum flavum was visualized, it was dissected with a curette and resected with Kerrison rongeurs.  Extensive ligamentum hypertrophy was noted, requiring a substantial amount of time and care for removal.  The dura was identified and palpated. The kerrison rongeur was then used to remove the medial facet bilaterally until no compression was noted.  A balltip probe was used to confirm decompression of the ipsilateral L4 nerve root. ? ?Additional attention was paid to completion of the contralateral foraminotomy until the contralateral L4 nerve root was completely free.  Once this was complete, L3-4 central decompression including medial facetectomy and foraminotomy was confirmed and decompression on both sides was confirmed. No CSF leak was noted. ? ?The lateral dura and left L4 nerve root were then protected and the disc herniation identified.  It was dissected free and the disc herniation removed in piecemeal fashion until there was complete decompression. ? ?A Depo-Medrol soaked Gelfoam pledget was placed in the defect.  The wound was copiously irrigated. The tube system was then removed under microscopic visualization and hemostasis was obtained with a bipolar.   ? ? ?The fascial layer was reapproximated with the use of a 0 Vicryl suture.  Subcutaneous tissue layer was reapproximated using 2-0 Vicryl suture.  3-0 monocryl was placed in subcuticular fashion. The skin was  then cleansed and Dermabond was used to close the skin opening.  Patient was then rotated back to the preoperative bed awakened from anesthesia and taken to recovery all counts are correct in this case. ? ?I performed the entire procedure with the assistance of Cooper Render PA as an Pensions consultant. ? ?Kaytlyn Din K. Izora Ribas MD  ?

## 2021-12-19 NOTE — Anesthesia Preprocedure Evaluation (Signed)
Anesthesia Evaluation  ?Patient identified by MRN, date of birth, ID band ?Patient awake ? ? ? ?Reviewed: ?Allergy & Precautions, NPO status , Patient's Chart, lab work & pertinent test results ? ?Airway ?Mallampati: II ? ?TM Distance: >3 FB ?Neck ROM: Full ? ? ? Dental ? ?(+) Teeth Intact ?  ?Pulmonary ?neg pulmonary ROS, former smoker,  ?  ?Pulmonary exam normal ? ?+ decreased breath sounds ? ? ? ? ? Cardiovascular ?hypertension, Pt. on medications ?+ CAD and + Cardiac Stents  ?negative cardio ROS ?Normal cardiovascular exam ?Rhythm:Regular Rate:Normal ? ? ?  ?Neuro/Psych ?negative neurological ROS ? negative psych ROS  ? GI/Hepatic ?negative GI ROS, Neg liver ROS, GERD  ,  ?Endo/Other  ?negative endocrine ROSdiabetes ? Renal/GU ?negative Renal ROS  ? ?  ?Musculoskeletal ? ?(+) Arthritis ,  ? Abdominal ?Normal abdominal exam  (+)   ?Peds ?negative pediatric ROS ?(+)  Hematology ?negative hematology ROS ?(+) Blood dyscrasia, anemia ,   ?Anesthesia Other Findings ?Past Medical History: ?No date: Anemia ?No date: Arthritis ?12/31/2017: Coronary artery disease ?    Comment:  a.) LHC 01/10/18: 100% mRCA, 25% o-pLAD, 25% oS1-S1, 25%  ?             o-pLCx, 25% oOM1-OM1; ref to [facility] for complex PCI. b.)  ?             LHC 01/25/18: 40% m-dLAD, subtotal occ dRCA, 70% pRCA ->  ?             PCI placing overlapping Synergy DES x 4 -> 2.25x74mm  ?             pRCA, 2.5x31mm pRCA, 3.0x20mm mRCA, 2.25x59mm dRCA. c.)  ?             LHC 07/01/18: widely patent RCA, 80% mPDA, 30% mLCx (iFR  ?             0.98), mild LAD Dz -> PCI placing a 2.25x3mm Synergy DES ?             to mPDA ?09/26/2017: Diastolic dysfunction ?    Comment:  a.)  TTE 09/26/2017: EF >55%; mild LVH; trivial  ?             MR/TR/PR; G1DD. ?No date: GERD (gastroesophageal reflux disease) ?No date: History of heart artery stent ?    Comment:  a.) TOTAL # stents = 5 (Synergy DES) --> 2.25 x 38 mm  ?             pRCA, 2.5 x 15 mm  pRCA, 3.0 x 33 mm mRCA, 2.25 x 38 mm  ?             dRCA, 2.25 x 12 mm mPDA ?No date: Hypercholesteremia ?No date: Hypertension ?No date: Lumbar radiculopathy ?No date: LVH (left ventricular hypertrophy) ?No date: Spinal stenosis of lumbar region with neurogenic claudication ?No date: T2DM (type 2 diabetes mellitus) (HCC) ? ?Past Surgical History: ?01/30/2018: CORONARY ANGIOPLASTY WITH STENT PLACEMENT; Left ?    Comment:  Procedure: CORONARY ANGIOPLASTY WITH STENT PLACEMENT;  ?             Location: [facility]; Surgeon: Lovie Chol, MD ?06/21/2018: CORONARY ANGIOPLASTY WITH STENT PLACEMENT; Left ?    Comment:  Procedure: CORONARY ANGIOPLASTY WITH STENT PLACEMENT;  ?             Location: [facility]; Surgeon: Lovie Chol, MD ?01/10/2018: CORONARY STENT INTERVENTION; N/A ?    Comment:  Procedure:  CORONARY STENT INTERVENTION;  Surgeon:  ?             Alwyn Pea, MD;  Location: [facility] INVASIVE CV LAB;  ?             Service: Cardiovascular;  Laterality: N/A; ?01/10/2018: LEFT HEART CATH AND CORONARY ANGIOGRAPHY; N/A ?    Comment:  Procedure: LEFT HEART CATH AND CORONARY ANGIOGRAPHY;   ?             Surgeon: Alwyn Pea, MD;  Location: [facility] INVASIVE ?             CV LAB;  Service: Cardiovascular;  Laterality: N/A; ?08/07/2016: TOTAL HIP ARTHROPLASTY; Right ?    Comment:  Procedure: TOTAL HIP ARTHROPLASTY;  Surgeon: Illene Labrador  ?             Hooten, MD;  Location: [facility] ORS;  Service: Orthopedics;   ?             Laterality: Right; ? ?BMI   ? Body Mass Index: 31.25 kg/m?  ?  ? ? Reproductive/Obstetrics ?negative OB ROS ? ?  ? ? ? ? ? ? ? ? ? ? ? ? ? ?  ?  ? ? ? ? ? ? ? ? ?Anesthesia Physical ?Anesthesia Plan ? ?ASA: 3 ? ?Anesthesia Plan: General  ? ?Post-op Pain Management:   ? ?Induction: Intravenous ? ?PONV Risk Score and Plan: Ondansetron, Dexamethasone, Midazolam and Treatment may vary due to age or medical condition ? ?Airway Management Planned: Oral ETT ? ?Additional Equipment:  ? ?Intra-op Plan:   ? ?Post-operative Plan: Extubation in OR ? ?Informed Consent: I have reviewed the patients History and Physical, chart, labs and discussed the procedure including the risks, benefits and alternatives for the proposed anesthesia with the patient or authorized representative who has indicated his/her understanding and acceptance.  ? ? ? ?Dental Advisory Given ? ?Plan Discussed with: CRNA and Surgeon ? ?Anesthesia Plan Comments:   ? ? ? ? ? ? ?Anesthesia Quick Evaluation ? ?

## 2021-12-19 NOTE — H&P (Signed)
I have reviewed and confirmed my history and physical from 11/29/2021 with no additions or changes. Plan for L L3-4 microdiscectomy and L3-5 decompression.  Risks and benefits reviewed. ? ?Heart sounds normal no MRG. Chest Clear to Auscultation Bilaterally. ? ? ?  ? ?

## 2021-12-19 NOTE — Discharge Summary (Signed)
Physician Discharge Summary  ?Patient ID: ?Samuel Hansen ?MRN: [id number] ?DOB/AGE: [birth year] 61 y.o. ? ?Admit date: 12/19/2021 ?Discharge date: 12/19/2021 ? ?Admission Diagnoses: lumbar radiculopathy and neurogenic claudication ? ?Discharge Diagnoses:  ?Active Problems: ?  * No active hospital problems. * ? ? ?Discharged Condition: good ? ?Hospital Course:  ?Samuel Hansen is a 61 y.o male s/p Left L3-4 microdiscectomy and L4-5 decompression. His interoperative course was uncomplicated. He was monitored in PACU and discharged home after ambulating, urinating, and tolerating PO intake.  ? ?Consults: None ? ?Significant Diagnostic Studies: none  ? ?Treatments: surgery: as above. Please see separately dictated operative report for further detail  ? ?Discharge Exam: ?Blood pressure (!) 166/94, pulse 78, temperature (!) 96.9 ?F (36.1 ?C), temperature source Temporal, resp. rate 16, height 5\' 7"  (1.702 m), weight 90.5 kg, SpO2 98 %. ?CN II-XII grossly intact ?5/5 strength BLE ?Incision c/d/i ? ?Disposition: Discharge disposition: 01-Home or Self Care ? ? ? ? ? ? ? ?Allergies as of 12/19/2021   ? ?   Reactions  ? Lisinopril Cough  ? ?  ? ?  ?Medication List  ?  ? ?STOP taking these medications   ? ?HYDROcodone-acetaminophen 5-325 MG tablet ?Commonly known as: NORCO/VICODIN ?  ? ?  ? ?TAKE these medications   ? ?acetaminophen 500 MG chewable tablet ?Commonly known as: TYLENOL ?Chew 1,000 mg by mouth as needed for pain. ?  ?APPLE CIDER VINEGAR PO ?Take 1 tablet by mouth daily. Gummies ?  ?aspirin EC 81 MG tablet ?Take 81 mg by mouth daily at 12 noon. ?  ?gabapentin 300 MG capsule ?Commonly known as: NEURONTIN ?Take 300 mg by mouth 2 (two) times daily. ?  ?methocarbamol 500 MG tablet ?Commonly known as: Robaxin ?Take 1 tablet (500 mg total) by mouth 4 (four) times daily. ?What changed:  ?when to take this ?reasons to take this ?  ?metoprolol succinate 50 MG 24 hr tablet ?Commonly known as: TOPROL-XL ?Take 50 mg by mouth daily. Take  with or immediately following a meal. ?  ?OVER THE COUNTER MEDICATION ?Take 1 tablet by mouth daily. Super Beets-Gummy ?  ?oxyCODONE-acetaminophen 7.5-325 MG tablet ?Commonly known as: Percocet ?Take 1 tablet by mouth every 4 (four) hours as needed for up to 5 days for severe pain. ?  ?senna 8.6 MG Tabs tablet ?Commonly known as: SENOKOT ?Take 1 tablet (8.6 mg total) by mouth daily as needed for mild constipation. ?  ? ?  ? ? Follow-up Information   ? ? 02-13-1979, PA Follow up on 01/05/2022.   ?Why: @3 :00pm ?Contact information: ?[address] ?[place] ?[phone] ? ? ?  ?  ? ?  ?  ? ?  ? ? ?Signed: ?[id number] ?12/19/2021, 4:17 PM ? ? ?

## 2021-12-19 NOTE — Progress Notes (Signed)
Patient ambulated to the restroom and back to post op room. Steady gait. Patient also urinated and is tolerating PO's.  ?

## 2021-12-19 NOTE — Anesthesia Postprocedure Evaluation (Signed)
Anesthesia Post Note ? ?Patient: Samuel Hansen ? ?Procedure(s) Performed: LEFT L3-4 MICRODISCECTOMY, L4-5 DECOMPRESSION (Spine Lumbar) ? ?Patient location during evaluation: PACU ?Anesthesia Type: General ?Level of consciousness: awake and oriented ?Pain management: pain level controlled ?Vital Signs Assessment: post-procedure vital signs reviewed and stable ?Respiratory status: spontaneous breathing and respiratory function stable ?Cardiovascular status: stable ?Anesthetic complications: no ? ? ?No notable events documented. ? ? ?Last Vitals:  ?Vitals:  ? 12/19/21 1000 12/19/21 1019  ?BP: (!) 161/81 (!) 166/94  ?Pulse: 81 78  ?Resp:  16  ?Temp: (!) 36.1 ?C (!) 36.1 ?C  ?SpO2: 95% 98%  ?  ?Last Pain:  ?Vitals:  ? 12/19/21 1019  ?TempSrc: Temporal  ?PainSc: 0-No pain  ? ? ?  ?  ?  ?  ?  ?  ? ?VAN STAVEREN,Abella Shugart ? ? ? ? ?

## 2021-12-19 NOTE — Transfer of Care (Signed)
Immediate Anesthesia Transfer of Care Note ? ?Patient: Samuel Hansen ? ?Procedure(s) Performed: LEFT L3-4 MICRODISCECTOMY, L4-5 DECOMPRESSION (Spine Lumbar) ? ?Patient Location: PACU ? ?Anesthesia Type:General ? ?Level of Consciousness: drowsy ? ?Airway & Oxygen Therapy: Patient Spontanous Breathing and Patient connected to face mask oxygen ? ?Post-op Assessment: Report given to RN and Post -op Vital signs reviewed and stable ? ?Post vital signs: Reviewed and stable ? ?Last Vitals:  ?Vitals Value Taken Time  ?BP 115/73   ?Temp 36.1 ?C 12/19/21 0925  ?Pulse 78 12/19/21 0930  ?Resp 15 12/19/21 0930  ?SpO2 100 % 12/19/21 0930  ?Vitals shown include unvalidated device data. ? ?Last Pain:  ?Vitals:  ? 12/19/21 0635  ?PainSc: 8   ?   ? ?  ? ?Complications: No notable events documented. ?

## 2021-12-19 NOTE — Anesthesia Procedure Notes (Signed)
Procedure Name: Intubation ?Date/Time: 12/19/2021 7:31 AM ?Performed by: Morene Crocker, CRNA ?Pre-anesthesia Checklist: Patient identified, Patient being monitored, Timeout performed, Emergency Drugs available and Suction available ?Patient Re-evaluated:Patient Re-evaluated prior to induction ?Oxygen Delivery Method: Circle system utilized ?Preoxygenation: Pre-oxygenation with 100% oxygen ?Induction Type: IV induction ?Ventilation: Mask ventilation without difficulty ?Laryngoscope Size: 3 and McGraph ?Grade View: Grade I ?Tube type: Oral ?Tube size: 7.5 mm ?Number of attempts: 1 ?Airway Equipment and Method: Stylet ?Placement Confirmation: ETT inserted through vocal cords under direct vision, positive ETCO2 and breath sounds checked- equal and bilateral ?Secured at: 22 cm ?Tube secured with: Tape ?Dental Injury: Teeth and Oropharynx as per pre-operative assessment  ? ? ? ? ?

## 2021-12-19 NOTE — Progress Notes (Signed)
Pharmacy Antibiotic Note ? ?Samuel Hansen is a 61 y.o. male admitted on (Not on file) with surgical prophylaxis.  Pharmacy has been consulted for Cefazolin dosing. ? ?Plan: ?TBW = 90.5 kg  ? ?Cefazolin 2 gm IV X 1 60 min pre-op ordered for 3/6 @ 0500.  ? ?  ? ?No data recorded. ? ?No results for input(s): WBC, CREATININE, LATICACIDVEN, VANCOTROUGH, VANCOPEAK, VANCORANDOM, GENTTROUGH, GENTPEAK, GENTRANDOM, TOBRATROUGH, TOBRAPEAK, TOBRARND, AMIKACINPEAK, AMIKACINTROU, AMIKACIN in the last 168 hours.  ?Estimated Creatinine Clearance: 87.5 mL/min (by C-G formula based on SCr of 0.94 mg/dL).   ? ?Allergies  ?Allergen Reactions  ? Lisinopril Cough  ? ? ?Antimicrobials this admission: ?  >>  ?  >>  ? ?Dose adjustments this admission: ? ? ?Microbiology results: ? BCx:  ? UCx:   ? Sputum:   ? MRSA PCR:  ? ?Thank you for allowing pharmacy to be a part of this patient?s care. ? ?Rahman Ferrall D ?12/19/2021 3:26 AM ? ?

## 2021-12-19 NOTE — Discharge Instructions (Addendum)
?Your surgeon has performed an operation on your lumbar spine (low back) to relieve pressure on one or more nerves. Many times, patients feel better immediately after surgery and can ?overdo it.? Even if you feel well, it is important that you follow these activity guidelines. If you do not let your back heal properly from the surgery, you can increase the chance of a disc herniation and/or return of your symptoms. The following are instructions to help in your recovery once you have been discharged from the hospital. ? ? ?Activity  ?  ?No bending, lifting, or twisting (?BLT?). Avoid lifting objects heavier than 10 pounds (gallon milk jug).  Where possible, avoid household activities that involve lifting, bending, pushing, or pulling such as laundry, vacuuming, grocery shopping, and childcare. Try to arrange for help from friends and family for these activities while your back heals. ? ?Increase physical activity slowly as tolerated.  Taking short walks is encouraged, but avoid strenuous exercise. Do not jog, run, bicycle, lift weights, or participate in any other exercises unless specifically allowed by your doctor. Avoid prolonged sitting, including car rides. ? ?Talk to your doctor before resuming sexual activity. ? ?You should not drive until cleared by your doctor. ? ?Until released by your doctor, you should not return to work or school.  You should rest at home and let your body heal.  ? ?You may shower two days after your surgery.  After showering, lightly dab your incision dry. Do not take a tub bath or go swimming for 3 weeks, or until approved by your doctor at your follow-up appointment. ? ?If you smoke, we strongly recommend that you quit.  Smoking has been proven to interfere with normal healing in your back and will dramatically reduce the success rate of your surgery. Please contact [facility] (800-QUIT-NOW) and use the resources at www.[facility].com for assistance in stopping smoking. ? ?Surgical  Incision ?  ?If you have a dressing on your incision, you may remove it three days after your surgery. Keep your incision area clean and dry. ? ?Your incision was closed with Dermabond glue. The glue should begin to peel away within about a week. ? ?Diet          ? ? You may return to your usual diet. Be sure to stay hydrated. ? ?When to Contact Us ? ?Although your surgery and recovery will likely be uneventful, you may have some residual numbness, aches, and pains in your back and/or legs. This is normal and should improve in the next few weeks. ? ?However, should you experience any of the following, contact us immediately: ?New numbness or weakness ?Pain that is progressively getting worse, and is not relieved by your pain medications or rest ?Bleeding, redness, swelling, pain, or drainage from surgical incision ?Chills or flu-like symptoms ?Fever greater than 101.0 F (38.3 C) ?Problems with bowel or bladder functions ?Difficulty breathing or shortness of breath ?Warmth, tenderness, or swelling in your calf ? ?Contact Information ?During office hours (Monday-Friday 9 am to 5 pm), please call your physician at [phone] ?After hours and weekends, please call [phone] and speak with the answering service, who will contact the doctor on call.  If that fails, call the [facility] Operator at [phone] and ask for the Neurosurgery Resident On Call  ?For a life-threatening emergency, call 911  ? ?AMBULATORY SURGERY  ?DISCHARGE INSTRUCTIONS ? ? ?The drugs that you were given will stay in your system until tomorrow so for the next 24 hours you should   not: ? ?Drive an automobile ?Make any legal decisions ?Drink any alcoholic beverage ? ? ?You may resume regular meals tomorrow.  Today it is better to start with liquids and gradually work up to solid foods. ? ?You may eat anything you prefer, but it is better to start with liquids, then soup and crackers, and gradually work up to solid foods. ? ? ?Please notify your  doctor immediately if you have any unusual bleeding, trouble breathing, redness and pain at the surgery site, drainage, fever, or pain not relieved by medication. ? ? ? ?Additional Instructions: ? ? ? ? ? ? ? ?Please contact your physician with any problems or Same Day Surgery at [phone], Monday through Friday 6 am to 4 pm, or [facility] Main number at [phone].  ?

## 2021-12-20 ENCOUNTER — Encounter: Payer: Self-pay | Admitting: Neurosurgery

## 2022-04-11 ENCOUNTER — Encounter: Payer: Self-pay | Admitting: Cardiology

## 2022-04-11 ENCOUNTER — Encounter
Admission: RE | Disposition: A | Discharge status: Home / Self Care | Payer: Self-pay | Source: Home / Self Care | Attending: Cardiology

## 2022-04-11 ENCOUNTER — Other Ambulatory Visit: Payer: Self-pay

## 2022-04-11 ENCOUNTER — Observation Stay
Admission: RE | Admit: 2022-04-11 | Discharge: 2022-04-12 | Disposition: A | Discharge status: Home / Self Care | Payer: Medicare Other | Attending: Cardiology | Admitting: Cardiology

## 2022-04-11 DIAGNOSIS — R9439 Abnormal result of other cardiovascular function study: Secondary | ICD-10-CM

## 2022-04-11 DIAGNOSIS — Z79899 Other long term (current) drug therapy: Secondary | ICD-10-CM | POA: Insufficient documentation

## 2022-04-11 DIAGNOSIS — I25110 Atherosclerotic heart disease of native coronary artery with unstable angina pectoris: Secondary | ICD-10-CM | POA: Diagnosis present

## 2022-04-11 DIAGNOSIS — T82855A Stenosis of coronary artery stent, initial encounter: Secondary | ICD-10-CM | POA: Insufficient documentation

## 2022-04-11 DIAGNOSIS — E785 Hyperlipidemia, unspecified: Secondary | ICD-10-CM | POA: Diagnosis not present

## 2022-04-11 DIAGNOSIS — Z01818 Encounter for other preprocedural examination: Secondary | ICD-10-CM

## 2022-04-11 DIAGNOSIS — Y712 Prosthetic and other implants, materials and accessory cardiovascular devices associated with adverse incidents: Secondary | ICD-10-CM | POA: Diagnosis not present

## 2022-04-11 DIAGNOSIS — I2510 Atherosclerotic heart disease of native coronary artery without angina pectoris: Secondary | ICD-10-CM | POA: Diagnosis present

## 2022-04-11 HISTORY — PX: CORONARY STENT INTERVENTION: CATH118234

## 2022-04-11 HISTORY — PX: LEFT HEART CATH AND CORONARY ANGIOGRAPHY: CATH118249

## 2022-04-11 LAB — POCT ACTIVATED CLOTTING TIME
Activated Clotting Time: 281 seconds
Activated Clotting Time: 287 seconds

## 2022-04-11 LAB — GLUCOSE, CAPILLARY: Glucose-Capillary: 133 mg/dL — ABNORMAL HIGH (ref 70–99)

## 2022-04-11 SURGERY — LEFT HEART CATH AND CORONARY ANGIOGRAPHY
Anesthesia: Moderate Sedation

## 2022-04-11 MED ORDER — HEPARIN SODIUM (PORCINE) 1000 UNIT/ML IJ SOLN
INTRAMUSCULAR | Status: AC
  Filled 2022-04-11: qty 10

## 2022-04-11 MED ORDER — SODIUM CHLORIDE 0.9 % WEIGHT BASED INFUSION: 1.0000 mL/kg/h | INTRAVENOUS | Status: DC

## 2022-04-11 MED ORDER — HEPARIN (PORCINE) IN NACL 1000-0.9 UT/500ML-% IV SOLN
INTRAVENOUS | Status: DC | PRN
  Administered 2022-04-11 (×3): 500 mL

## 2022-04-11 MED ORDER — CLOPIDOGREL BISULFATE 75 MG PO TABS
ORAL_TABLET | ORAL | Status: AC
  Filled 2022-04-11: qty 8

## 2022-04-11 MED ORDER — ASPIRIN 81 MG PO CHEW
81.0000 mg | CHEWABLE_TABLET | ORAL | Status: AC
  Administered 2022-04-11: 81 mg via ORAL

## 2022-04-11 MED ORDER — CLOPIDOGREL BISULFATE 75 MG PO TABS: 75.0000 mg | ORAL_TABLET | Freq: Every day | ORAL | Status: DC

## 2022-04-11 MED ORDER — NITROGLYCERIN 1 MG/10 ML FOR IR/CATH LAB
INTRA_ARTERIAL | Status: DC | PRN
  Administered 2022-04-11: 200 ug via INTRACORONARY
  Administered 2022-04-11: 300 ug via INTRACORONARY

## 2022-04-11 MED ORDER — ASPIRIN 81 MG PO CHEW
CHEWABLE_TABLET | ORAL | Status: AC
  Filled 2022-04-11: qty 1

## 2022-04-11 MED ORDER — ASPIRIN 81 MG PO CHEW
CHEWABLE_TABLET | ORAL | Status: AC
  Filled 2022-04-11: qty 3

## 2022-04-11 MED ORDER — METOPROLOL TARTRATE 5 MG/5ML IV SOLN
INTRAVENOUS | Status: AC
  Filled 2022-04-11: qty 5

## 2022-04-11 MED ORDER — SODIUM CHLORIDE 0.9% FLUSH
3.0000 mL | Freq: Two times a day (BID) | INTRAVENOUS | Status: DC
Start: 2022-04-11 — End: 2022-04-12

## 2022-04-11 MED ORDER — METOPROLOL TARTRATE 5 MG/5ML IV SOLN
INTRAVENOUS | Status: DC | PRN
  Administered 2022-04-11: 5 mg via INTRAVENOUS

## 2022-04-11 MED ORDER — FENTANYL CITRATE (PF) 100 MCG/2ML IJ SOLN
INTRAMUSCULAR | Status: AC
  Filled 2022-04-11: qty 2

## 2022-04-11 MED ORDER — SODIUM CHLORIDE 0.9% FLUSH: 3.0000 mL | INTRAVENOUS | Status: DC | PRN

## 2022-04-11 MED ORDER — FENTANYL CITRATE (PF) 100 MCG/2ML IJ SOLN
INTRAMUSCULAR | Status: DC | PRN
  Administered 2022-04-11: 50 ug via INTRAVENOUS

## 2022-04-11 MED ORDER — ONDANSETRON HCL 4 MG/2ML IJ SOLN: 4.0000 mg | Freq: Four times a day (QID) | INTRAMUSCULAR | Status: DC | PRN

## 2022-04-11 MED ORDER — MIDAZOLAM HCL 2 MG/2ML IJ SOLN
INTRAMUSCULAR | Status: DC | PRN
  Administered 2022-04-11: 1 mg via INTRAVENOUS

## 2022-04-11 MED ORDER — METOPROLOL SUCCINATE ER 50 MG PO TB24
50.0000 mg | ORAL_TABLET | Freq: Every day | ORAL | Status: DC
  Administered 2022-04-11: 50 mg via ORAL
  Filled 2022-04-11: qty 1

## 2022-04-11 MED ORDER — LOSARTAN POTASSIUM 25 MG PO TABS
25.0000 mg | ORAL_TABLET | Freq: Every day | ORAL | Status: DC
Start: 2022-04-11 — End: 2022-04-12
  Administered 2022-04-11: 25 mg via ORAL
  Filled 2022-04-11 (×2): qty 1

## 2022-04-11 MED ORDER — SODIUM CHLORIDE 0.9% FLUSH: 3.0000 mL | Freq: Two times a day (BID) | INTRAVENOUS | Status: DC

## 2022-04-11 MED ORDER — SODIUM CHLORIDE 0.9 % WEIGHT BASED INFUSION
1.0000 mL/kg/h | INTRAVENOUS | Status: AC
Start: 2022-04-11 — End: 2022-04-11
  Administered 2022-04-11: 1 mL/kg/h via INTRAVENOUS

## 2022-04-11 MED ORDER — SODIUM CHLORIDE 0.9 % WEIGHT BASED INFUSION
3.0000 mL/kg/h | INTRAVENOUS | Status: AC
  Administered 2022-04-11: 3 mL/kg/h via INTRAVENOUS

## 2022-04-11 MED ORDER — ATORVASTATIN CALCIUM 80 MG PO TABS
80.0000 mg | ORAL_TABLET | Freq: Every evening | ORAL | Status: DC
  Administered 2022-04-11: 80 mg via ORAL
  Filled 2022-04-11: qty 1

## 2022-04-11 MED ORDER — HEPARIN (PORCINE) IN NACL 1000-0.9 UT/500ML-% IV SOLN
INTRAVENOUS | Status: AC
  Filled 2022-04-11: qty 1000

## 2022-04-11 MED ORDER — ASPIRIN 81 MG PO CHEW
CHEWABLE_TABLET | ORAL | Status: DC | PRN
  Administered 2022-04-11: 243 mg via ORAL

## 2022-04-11 MED ORDER — MIDAZOLAM HCL 2 MG/2ML IJ SOLN
INTRAMUSCULAR | Status: AC
  Filled 2022-04-11: qty 2

## 2022-04-11 MED ORDER — LIDOCAINE HCL (PF) 1 % IJ SOLN
INTRAMUSCULAR | Status: DC | PRN
  Administered 2022-04-11: 2 mL

## 2022-04-11 MED ORDER — ACETAMINOPHEN 325 MG PO TABS: 650.0000 mg | ORAL_TABLET | ORAL | Status: DC | PRN

## 2022-04-11 MED ORDER — VERAPAMIL HCL 2.5 MG/ML IV SOLN
INTRAVENOUS | Status: DC | PRN
  Administered 2022-04-11 (×2): 2.5 mg via INTRA_ARTERIAL

## 2022-04-11 MED ORDER — NITROGLYCERIN 1 MG/10 ML FOR IR/CATH LAB
INTRA_ARTERIAL | Status: AC
  Filled 2022-04-11: qty 10

## 2022-04-11 MED ORDER — HYDRALAZINE HCL 20 MG/ML IJ SOLN: 10.0000 mg | INTRAMUSCULAR | Status: AC | PRN

## 2022-04-11 MED ORDER — SODIUM CHLORIDE 0.9 % IV SOLN: 250.0000 mL | INTRAVENOUS | Status: DC | PRN

## 2022-04-11 MED ORDER — IOHEXOL 300 MG/ML  SOLN
INTRAMUSCULAR | Status: DC | PRN
  Administered 2022-04-11: 206 mL

## 2022-04-11 MED ORDER — ASPIRIN 81 MG PO CHEW: 81.0000 mg | CHEWABLE_TABLET | Freq: Every day | ORAL | Status: DC

## 2022-04-11 MED ORDER — VERAPAMIL HCL 2.5 MG/ML IV SOLN
INTRAVENOUS | Status: AC
  Filled 2022-04-11: qty 2

## 2022-04-11 MED ORDER — LABETALOL HCL 5 MG/ML IV SOLN: 10.0000 mg | INTRAVENOUS | Status: AC | PRN

## 2022-04-11 MED ORDER — HEPARIN SODIUM (PORCINE) 1000 UNIT/ML IJ SOLN
INTRAMUSCULAR | Status: DC | PRN
  Administered 2022-04-11: 5000 [IU] via INTRAVENOUS
  Administered 2022-04-11: 4500 [IU] via INTRAVENOUS
  Administered 2022-04-11: 3000 [IU] via INTRAVENOUS

## 2022-04-11 MED ORDER — SODIUM CHLORIDE 0.9 % IV SOLN
250.0000 mL | INTRAVENOUS | Status: DC | PRN
Start: 2022-04-11 — End: 2022-04-12

## 2022-04-11 MED ORDER — CLOPIDOGREL BISULFATE 75 MG PO TABS
ORAL_TABLET | ORAL | Status: DC | PRN
  Administered 2022-04-11: 600 mg via ORAL

## 2022-04-11 MED ORDER — LIDOCAINE HCL 1 % IJ SOLN
INTRAMUSCULAR | Status: AC
  Filled 2022-04-11: qty 20

## 2022-04-11 SURGICAL SUPPLY — 22 items
BALLN TREK RX 2.5X15 (BALLOONS) ×3
BALLN ~~LOC~~ EUPHORA RX 3.5X15 (BALLOONS) ×3
BALLOON TREK RX 2.5X15 (BALLOONS) IMPLANT
BALLOON ~~LOC~~ EUPHORA RX 3.5X15 (BALLOONS) IMPLANT
BAND ZEPHYR COMPRESS 30 LONG (HEMOSTASIS) ×1 IMPLANT
CATH 5FR JL3.5 JR4 ANG PIG MP (CATHETERS) ×1 IMPLANT
CATH INFINITI 5FR JL4 (CATHETERS) ×1 IMPLANT
CATH INFINITI 5FR JL5 (CATHETERS) ×1 IMPLANT
CATH VISTA GUIDE 6FR XB4 (CATHETERS) ×1 IMPLANT
DRAPE BRACHIAL (DRAPES) ×1 IMPLANT
GLIDESHEATH SLEND SS 6F .021 (SHEATH) ×1 IMPLANT
GUIDEWIRE INQWIRE 1.5J.035X260 (WIRE) IMPLANT
INQWIRE 1.5J .035X260CM (WIRE) ×3
KIT ENCORE 26 ADVANTAGE (KITS) ×1 IMPLANT
KIT SYRINGE INJ CVI SPIKEX1 (MISCELLANEOUS) ×1 IMPLANT
PACK CARDIAC CATH (CUSTOM PROCEDURE TRAY) ×3 IMPLANT
PROTECTION STATION PRESSURIZED (MISCELLANEOUS) ×3
SET ATX SIMPLICITY (MISCELLANEOUS) ×1 IMPLANT
STATION PROTECTION PRESSURIZED (MISCELLANEOUS) IMPLANT
STENT ONYX FRONTIER 3.0X18 (Permanent Stent) ×1 IMPLANT
TUBING CIL FLEX 10 FLL-RA (TUBING) ×1 IMPLANT
WIRE ASAHI PROWATER 180CM (WIRE) ×1 IMPLANT

## 2022-04-12 DIAGNOSIS — I25110 Atherosclerotic heart disease of native coronary artery with unstable angina pectoris: Secondary | ICD-10-CM | POA: Diagnosis not present

## 2022-04-12 LAB — BASIC METABOLIC PANEL
Anion gap: 7 (ref 5–15)
BUN: 8 mg/dL (ref 8–23)
CO2: 25 mmol/L (ref 22–32)
Calcium: 9 mg/dL (ref 8.9–10.3)
Chloride: 108 mmol/L (ref 98–111)
Creatinine, Ser: 0.86 mg/dL (ref 0.61–1.24)
GFR, Estimated: >60 mL/min (ref >60)
Glucose, Bld: 140 mg/dL — ABNORMAL HIGH (ref 70–99)
Potassium: 4.4 mmol/L (ref 3.5–5.1)
Sodium: 140 mmol/L (ref 135–145)

## 2022-04-12 LAB — CBC
HCT: 35.9 % — ABNORMAL LOW (ref 39.0–52.0)
Hemoglobin: 11.1 g/dL — ABNORMAL LOW (ref 13.0–17.0)
MCH: 24.8 pg — ABNORMAL LOW (ref 26.0–34.0)
MCHC: 30.9 g/dL (ref 30.0–36.0)
MCV: 80.1 fL (ref 80.0–100.0)
Platelets: 309 10*3/uL (ref 150–400)
RBC: 4.48 MIL/uL (ref 4.22–5.81)
RDW: 15.1 % (ref 11.5–15.5)
WBC: 6 10*3/uL (ref 4.0–10.5)
nRBC: 0 % (ref 0.0–0.2)

## 2022-04-12 MED ORDER — ATORVASTATIN CALCIUM 80 MG PO TABS: 80.0000 mg | ORAL_TABLET | Freq: Every evening | ORAL | 6 refills | Status: AC

## 2022-04-12 MED ORDER — CLOPIDOGREL BISULFATE 75 MG PO TABS: 75.0000 mg | ORAL_TABLET | Freq: Every day | ORAL | 6 refills | Status: AC

## 2022-04-12 NOTE — Progress Notes (Signed)
AVS given to patient and wife. All DC instructions and medications reviewed. All questions answered. Clothing returned to patient.

## 2022-04-12 NOTE — Discharge Summary (Signed)
Physician Discharge Summary  Patient ID: Samuel Hansen MRN: [id number] DOB/AGE: [birth year] 61 y.o.  Admit date: 04/11/2022 Discharge date: 04/12/2022  Primary Discharge Diagnosis unstable angina Secondary Discharge Diagnosis for Neri artery disease  Significant Diagnostic Studies: yes  Consults: None  Hospital Course: The patient underwent elective cardiac catheterization 04/11/2022 which revealed two-vessel coronary artery disease with diffuse in-stent restenosis and occlusion of multiple overlapping coronary stents distal right coronary artery, and 85% stenosis proximal left circumflex.  Left ventriculography revealed normal left ventricular function.  The patient underwent successful percutaneous coronary intervention receiving 2.0 x 18 mm Onyx Frontier stent proximal left circumflex.  Chronically occluded distal right coronary artery was felt to be best treated medically.  The patient had an uncomplicated hospital stay overnight, without chest pain or shortness of breath.  On the morning of 04/12/2022, patient was chest pain-free, ambulating without difficulty and discharged home.  The patient is to be discharged home on his normal home medications with the addition of clopidogrel 75 mg daily, and atorvastatin 80 mg daily.  He is scheduled to see me in follow-up in 1 week.   Discharge Exam: Blood pressure (!) 160/81, pulse 67, temperature 98 F (36.7 C), resp. rate 17, height 5\' 7"  (1.702 m), weight 92.5 kg, SpO2 100 %.   General appearance: alert Labs:   Lab Results  Component Value Date   WBC 6.0 04/12/2022   HGB 11.1 (L) 04/12/2022   HCT 35.9 (L) 04/12/2022   MCV 80.1 04/12/2022   PLT 309 04/12/2022    Recent Labs  Lab 04/12/22 0610  NA 140  K 4.4  CL 108  CO2 25  BUN 8  CREATININE 0.86  CALCIUM 9.0  GLUCOSE 140*      Radiology:  EKG: Sinus rhythm with premature atrial contractions  FOLLOW UP PLANS AND APPOINTMENTS Discharge Instructions     AMB Referral to  Cardiac Rehabilitation - Phase II   Complete by: As directed    Diagnosis: Coronary Stents   After initial evaluation and assessments completed: Virtual Based Care may be provided alone or in conjunction with Phase 2 Cardiac Rehab based on patient barriers.: Yes      Allergies as of 04/12/2022       Reactions   Lisinopril Cough        Medication List     STOP taking these medications    gabapentin 300 MG capsule Commonly known as: NEURONTIN   methocarbamol 500 MG tablet Commonly known as: Robaxin   senna 8.6 MG Tabs tablet Commonly known as: SENOKOT       TAKE these medications    acetaminophen 500 MG chewable tablet Commonly known as: TYLENOL Chew 1,000 mg by mouth as needed for pain.   APPLE CIDER VINEGAR PO Take 1 tablet by mouth daily. Gummies   aspirin EC 81 MG tablet Take 81 mg by mouth daily at 12 noon.   atorvastatin 80 MG tablet Commonly known as: LIPITOR Take 1 tablet (80 mg total) by mouth every evening.   clopidogrel 75 MG tablet Commonly known as: PLAVIX Take 1 tablet (75 mg total) by mouth daily with breakfast.   losartan 25 MG tablet Commonly known as: COZAAR Take 25 mg by mouth daily.   metoprolol succinate 50 MG 24 hr tablet Commonly known as: TOPROL-XL Take 50 mg by mouth daily. Take with or immediately following a meal.   OVER THE COUNTER MEDICATION Take 1 tablet by mouth daily. Super Beets-Gummy   oxyCODONE-acetaminophen 7.5-325 MG tablet  Commonly known as: PERCOCET Take 1 tablet by mouth every 4 (four) hours as needed for severe pain.        Follow-up Information     Rebekha Diveley, MD Follow up in 1 week(s).   Specialty: Cardiology Contact information: [address] [facility] [place] [address] [phone]                 BRING ALL MEDICATIONS WITH YOU TO FOLLOW UP APPOINTMENTS  Time spent with patient to include physician time: 25 Signed:  Marcina Millard MD,  PhD, [facility] 04/12/2022, 8:01 AM

## 2022-04-13 LAB — LIPOPROTEIN A (LPA): Lipoprotein (a): 90.1 nmol/L — ABNORMAL HIGH (ref <75.0)

## 2023-03-09 ENCOUNTER — Other Ambulatory Visit (INDEPENDENT_AMBULATORY_CARE_PROVIDER_SITE_OTHER): Payer: Self-pay | Admitting: Nurse Practitioner

## 2023-03-09 DIAGNOSIS — I739 Peripheral vascular disease, unspecified: Secondary | ICD-10-CM

## 2023-03-15 ENCOUNTER — Encounter (INDEPENDENT_AMBULATORY_CARE_PROVIDER_SITE_OTHER): Payer: Self-pay

## 2023-03-15 ENCOUNTER — Encounter (INDEPENDENT_AMBULATORY_CARE_PROVIDER_SITE_OTHER): Payer: Self-pay | Admitting: Nurse Practitioner

## 2023-04-25 IMAGING — MR MR LUMBAR SPINE W/O CM
5 series · 31 of 48 positions shown · non-contrast
Comparison: None.

CLINICAL DATA: Lumbar radiculopathy.

EXAM:
MRI LUMBAR SPINE WITHOUT CONTRAST
TECHNIQUE: Multiplanar, multisequence MR imaging of the lumbar spine was
performed. No intravenous contrast was administered.

[Series 5: T2 · sagittal · 4.0mm · 0.81mm/px · 6 of 17 slices shown (1 of 2)]
[im 1/17]
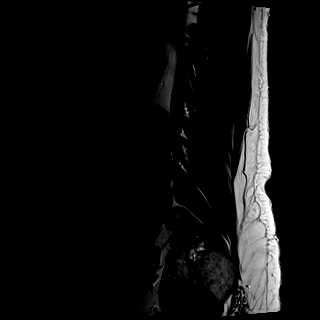
[im 4/17]
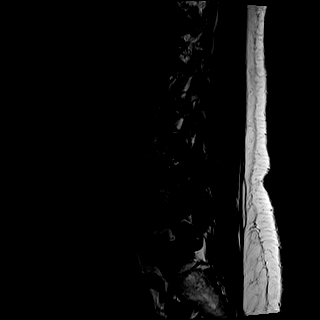
[im 7/17]
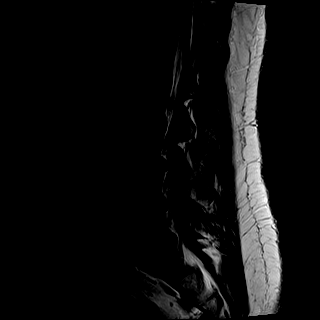
[im 10/17]
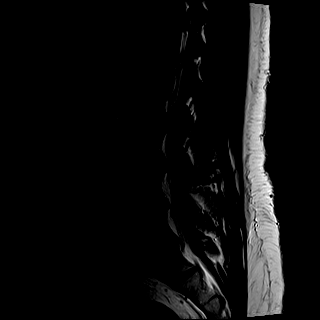
[im 13/17]
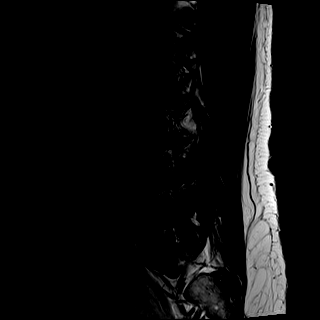
[im 17/17]
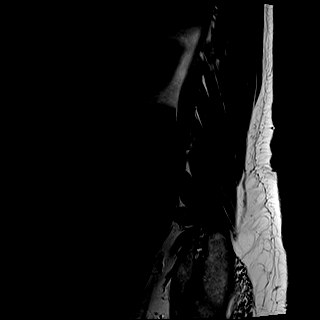

[Series 6: T1 · sagittal · 4.0mm · 0.81mm/px · 7 of 17 slices shown (1 of 2)]
[im 1/17]
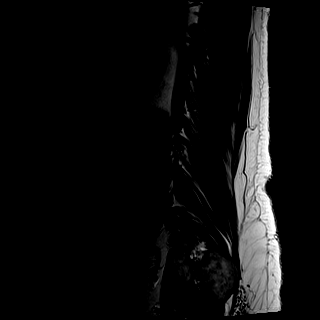
[im 3/17]
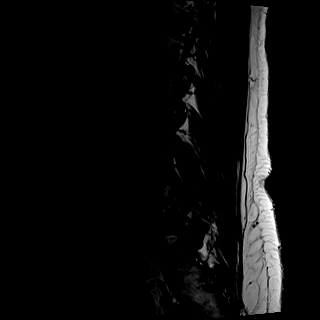
[im 6/17]
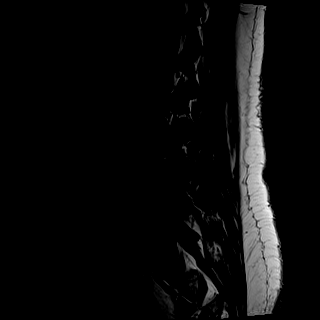
[im 9/17]
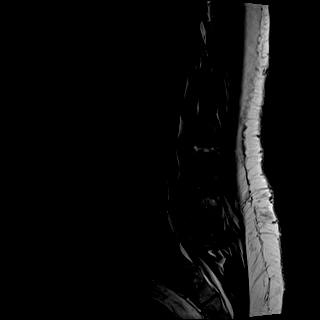
[im 11/17]
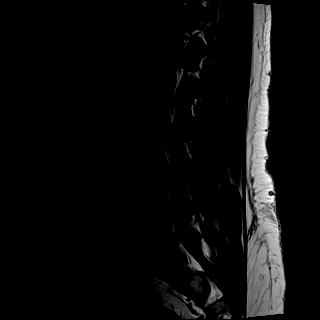
[im 14/17]
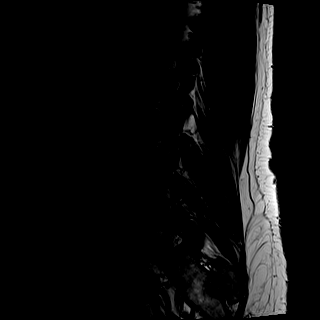
[im 17/17]
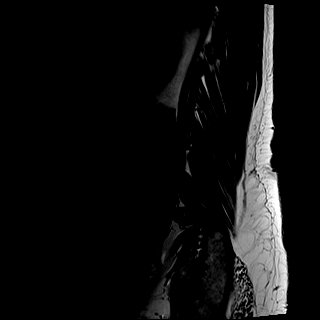

[Series 7: STIR · sagittal · 4.0mm · 0.41mm/px · 2 of 17 slices shown]
[im 1/17]
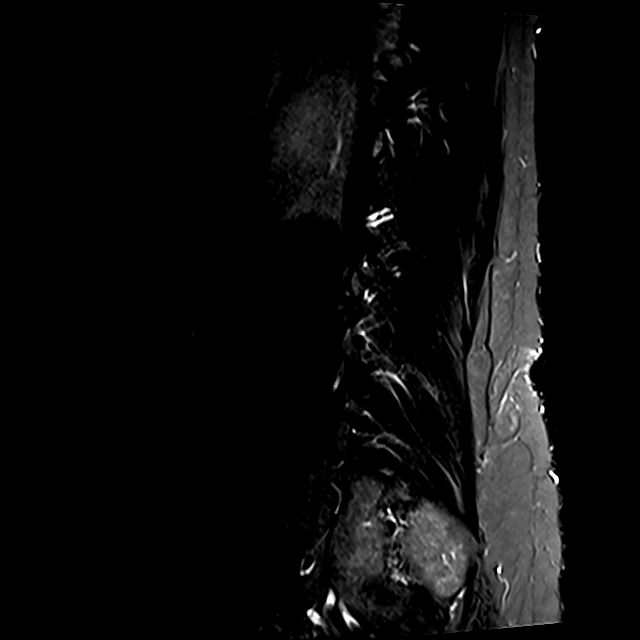
[im 3/17]
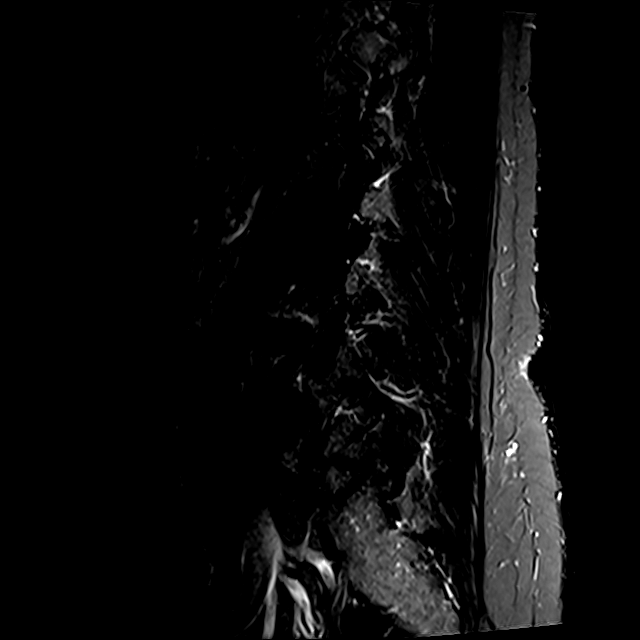

[Series 8: T2 · axial · 4.0mm · 0.78mm/px · z∈[-88,+125]mm · 8 of 36 slices shown (2 of 2)]
[im 1/36]
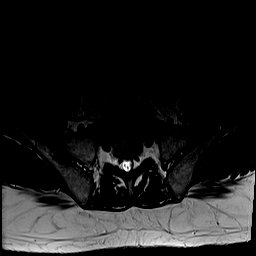
[im 6/36]
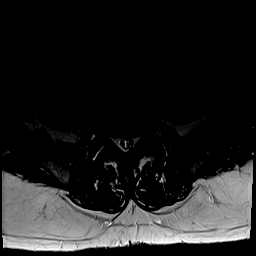
[im 11/36]
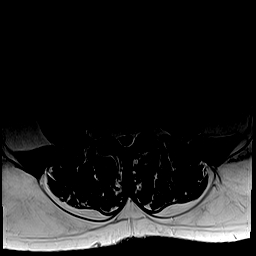
[im 17/36]
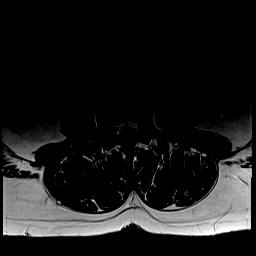
[im 19/36]
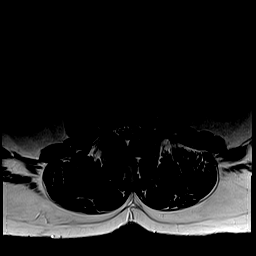
[im 25/36]
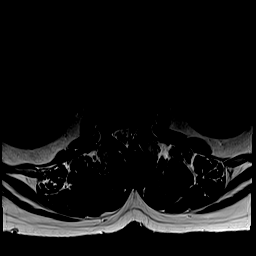
[im 30/36]
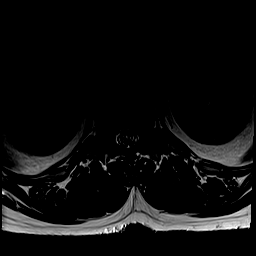
[im 36/36]
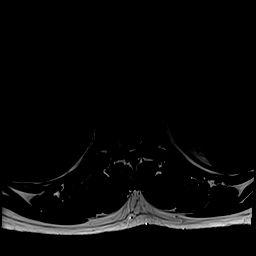

[Series 9: T1 · axial · 4.0mm · 0.39mm/px · z∈[-88,+125]mm · 8 of 36 slices shown (2 of 2)]
[im 1/36]
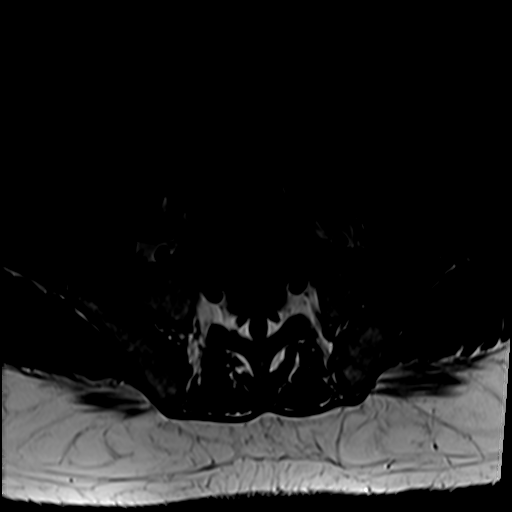
[im 6/36]
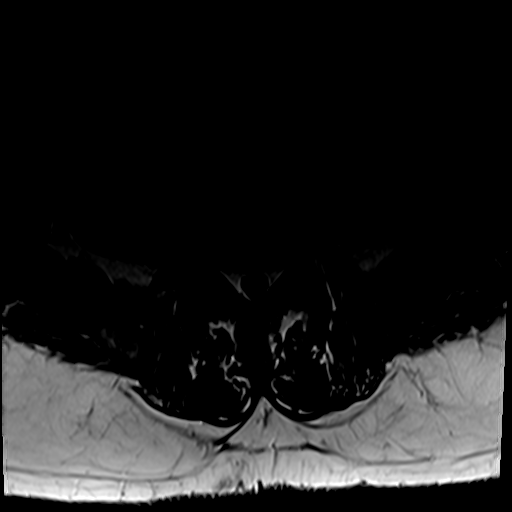
[im 11/36]
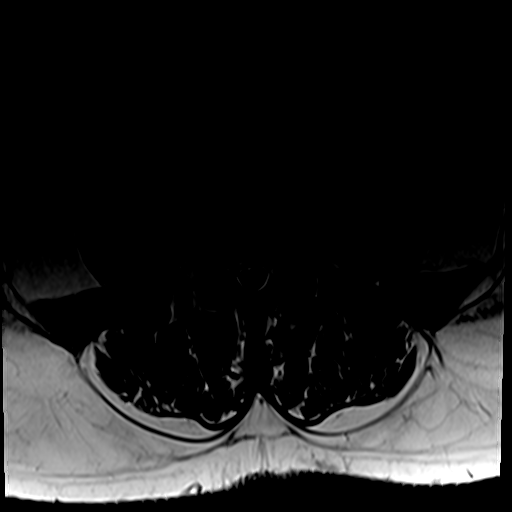
[im 17/36]
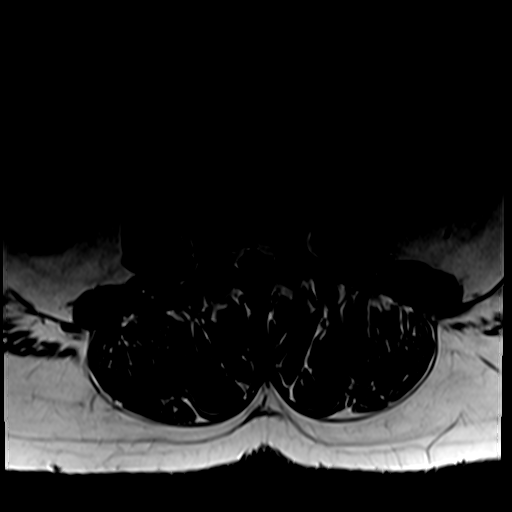
[im 19/36]
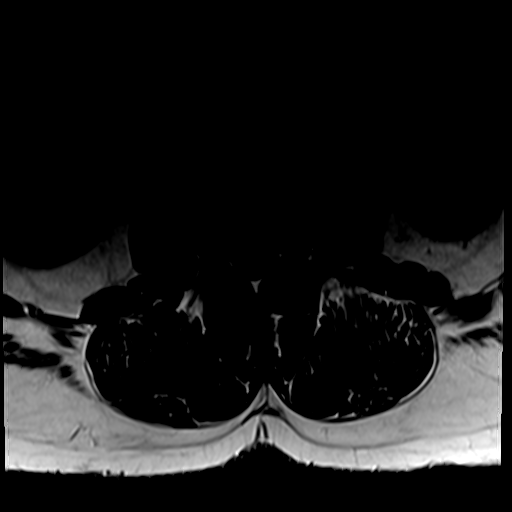
[im 25/36]
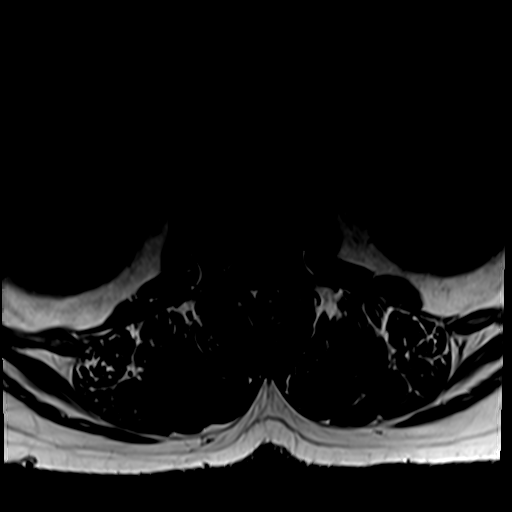
[im 30/36]
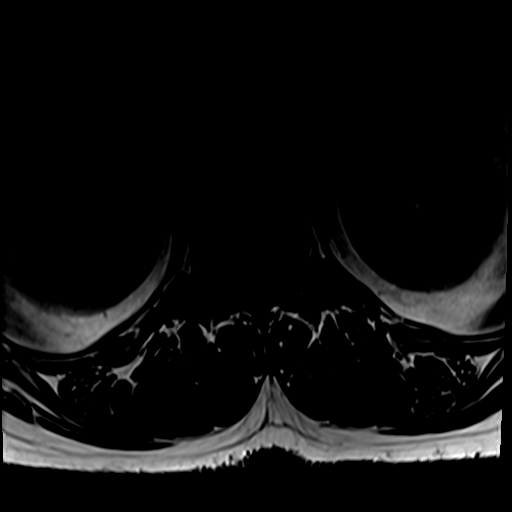
[im 36/36]
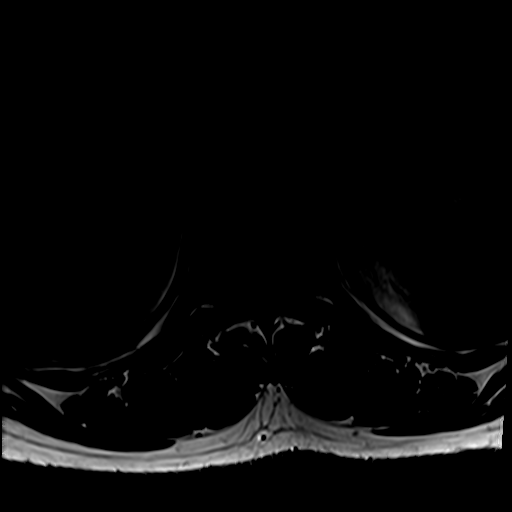

[31 of 48 positions shown; findings below may reference images not displayed]

FINDINGS: Segmentation:  Standard.

Alignment:  Physiologic.

Vertebrae: No fracture, evidence of discitis, or bone lesion.
Congenitally small spinal canal. Endplate degenerative changes at
L3-4.

Conus medullaris and cauda equina: Conus extends to the L1-2 level.
Conus and cauda equina appear normal.

Paraspinal and other soft tissues: Negative.

Disc levels:

T12-L1: No spinal canal or neural stenosis.

L1-2: No spinal canal or neural foraminal stenosis.

L2-3: Mild facet degenerative changes. No spinal canal or neural
foraminal stenosis.

L3-4: Loss of disc height, disc bulge with superimposed left
central/subarticular disc protrusion, mild facet degenerative
changes and ligamentum flavum redundancy. Findings result in
moderate to severe spinal canal stenosis with effacement of the left
subarticular zone and mild bilateral neural foraminal narrowing.

L4-5: Mild loss of disc height, disc bulge, mild facet degenerative
changes, ligamentum flavum redundancy and epidural lipomatosis
resulting in moderate narrowing of the thecal sac, mild narrowing of
the bilateral subarticular zones and mild bilateral neural foraminal
narrowing.

L5-S1: Shallow disc bulge and moderate facet degenerative changes
resulting in moderate bilateral neural foraminal narrowing. No
spinal canal stenosis.
IMPRESSION: 1. Left central/subarticular disc herniation at L3-4 superimposed on
a congenitally small spinal canal resulting in moderate to severe
spinal canal stenosis with effacement of the left subarticular zone.
2. Moderate spinal canal stenosis with mild narrowing of the
bilateral subarticular zones and mild bilateral neural foraminal
narrowing at L4-5.
3. Moderate bilateral neural foraminal narrowing at L5-S1.

## 2023-05-09 ENCOUNTER — Encounter (INDEPENDENT_AMBULATORY_CARE_PROVIDER_SITE_OTHER): Payer: Self-pay

## 2023-05-09 ENCOUNTER — Encounter (INDEPENDENT_AMBULATORY_CARE_PROVIDER_SITE_OTHER): Payer: Self-pay | Admitting: Nurse Practitioner

## 2024-03-04 ENCOUNTER — Encounter (INDEPENDENT_AMBULATORY_CARE_PROVIDER_SITE_OTHER): Payer: Self-pay

## 2024-08-14 ENCOUNTER — Other Ambulatory Visit: Payer: Self-pay | Admitting: Cardiology

## 2024-08-14 DIAGNOSIS — I739 Peripheral vascular disease, unspecified: Secondary | ICD-10-CM

## 2024-08-20 ENCOUNTER — Ambulatory Visit
Admission: RE | Admit: 2024-08-20 | Discharge: 2024-08-20 | Disposition: A | Discharge status: Home / Self Care | Source: Ambulatory Visit | Attending: Cardiology | Admitting: Cardiology

## 2024-08-20 DIAGNOSIS — I739 Peripheral vascular disease, unspecified: Secondary | ICD-10-CM | POA: Insufficient documentation

## 2024-09-03 ENCOUNTER — Other Ambulatory Visit: Payer: Self-pay | Admitting: Cardiology

## 2024-09-03 DIAGNOSIS — I739 Peripheral vascular disease, unspecified: Secondary | ICD-10-CM
